# Patient Record
Sex: Female | Born: 1935 | Race: White | Hispanic: No | Smoking: Never smoker
Health system: Southern US, Community
[De-identification: ages and names within clinical notes are randomized; demographics above are authoritative.]

## PROBLEM LIST (undated history)

## (undated) DIAGNOSIS — F419 Anxiety disorder, unspecified: Secondary | ICD-10-CM

## (undated) DIAGNOSIS — F329 Major depressive disorder, single episode, unspecified: Secondary | ICD-10-CM

## (undated) DIAGNOSIS — F0390 Unspecified dementia without behavioral disturbance: Secondary | ICD-10-CM

## (undated) DIAGNOSIS — E785 Hyperlipidemia, unspecified: Secondary | ICD-10-CM

## (undated) DIAGNOSIS — I10 Essential (primary) hypertension: Secondary | ICD-10-CM

## (undated) DIAGNOSIS — E119 Type 2 diabetes mellitus without complications: Secondary | ICD-10-CM

## (undated) DIAGNOSIS — F32A Depression, unspecified: Secondary | ICD-10-CM

## (undated) HISTORY — DX: Anxiety disorder, unspecified: F41.9

## (undated) HISTORY — PX: OTHER SURGICAL HISTORY: SHX169

## (undated) HISTORY — DX: Major depressive disorder, single episode, unspecified: F32.9

## (undated) HISTORY — DX: Depression, unspecified: F32.A

---

## 2001-11-27 ENCOUNTER — Inpatient Hospital Stay (HOSPITAL_COMMUNITY): Admission: EM | Admit: 2001-11-27 | Discharge: 2001-11-28 | Payer: Self-pay

## 2001-11-28 ENCOUNTER — Encounter: Payer: Self-pay | Admitting: Internal Medicine

## 2002-08-04 ENCOUNTER — Encounter: Payer: Self-pay | Admitting: Internal Medicine

## 2002-08-04 ENCOUNTER — Encounter: Admission: RE | Admit: 2002-08-04 | Discharge: 2002-08-04 | Payer: Self-pay | Admitting: Internal Medicine

## 2002-08-28 ENCOUNTER — Encounter: Admission: RE | Admit: 2002-08-28 | Discharge: 2002-08-28 | Payer: Self-pay | Admitting: Internal Medicine

## 2002-08-28 ENCOUNTER — Encounter: Payer: Self-pay | Admitting: Internal Medicine

## 2003-02-11 ENCOUNTER — Other Ambulatory Visit: Admission: RE | Admit: 2003-02-11 | Discharge: 2003-02-11 | Payer: Self-pay | Admitting: Obstetrics and Gynecology

## 2004-02-29 ENCOUNTER — Ambulatory Visit (HOSPITAL_COMMUNITY): Admission: RE | Admit: 2004-02-29 | Discharge: 2004-02-29 | Payer: Self-pay | Admitting: Internal Medicine

## 2004-04-03 ENCOUNTER — Encounter: Admission: RE | Admit: 2004-04-03 | Discharge: 2004-04-03 | Payer: Self-pay | Admitting: Internal Medicine

## 2004-05-05 ENCOUNTER — Ambulatory Visit: Payer: Self-pay | Admitting: Internal Medicine

## 2004-10-26 ENCOUNTER — Ambulatory Visit: Payer: Self-pay | Admitting: Internal Medicine

## 2005-05-21 ENCOUNTER — Ambulatory Visit: Payer: Self-pay | Admitting: Internal Medicine

## 2005-05-30 ENCOUNTER — Other Ambulatory Visit: Admission: RE | Admit: 2005-05-30 | Discharge: 2005-05-30 | Payer: Self-pay | Admitting: Obstetrics and Gynecology

## 2005-06-27 ENCOUNTER — Encounter: Payer: Self-pay | Admitting: Internal Medicine

## 2005-10-10 ENCOUNTER — Ambulatory Visit: Payer: Self-pay | Admitting: Internal Medicine

## 2005-10-16 ENCOUNTER — Encounter: Admission: RE | Admit: 2005-10-16 | Discharge: 2005-10-16 | Payer: Self-pay | Admitting: Internal Medicine

## 2005-10-26 ENCOUNTER — Ambulatory Visit: Payer: Self-pay | Admitting: Internal Medicine

## 2005-10-30 ENCOUNTER — Ambulatory Visit: Payer: Self-pay | Admitting: Internal Medicine

## 2006-04-17 ENCOUNTER — Ambulatory Visit: Payer: Self-pay | Admitting: Internal Medicine

## 2006-10-04 DIAGNOSIS — M949 Disorder of cartilage, unspecified: Secondary | ICD-10-CM

## 2006-10-04 DIAGNOSIS — M899 Disorder of bone, unspecified: Secondary | ICD-10-CM | POA: Insufficient documentation

## 2006-10-04 DIAGNOSIS — E039 Hypothyroidism, unspecified: Secondary | ICD-10-CM | POA: Insufficient documentation

## 2006-10-04 DIAGNOSIS — D320 Benign neoplasm of cerebral meninges: Secondary | ICD-10-CM | POA: Insufficient documentation

## 2006-12-08 ENCOUNTER — Encounter: Payer: Self-pay | Admitting: Internal Medicine

## 2006-12-19 ENCOUNTER — Ambulatory Visit: Payer: Self-pay | Admitting: Internal Medicine

## 2006-12-19 DIAGNOSIS — E785 Hyperlipidemia, unspecified: Secondary | ICD-10-CM

## 2006-12-19 DIAGNOSIS — B351 Tinea unguium: Secondary | ICD-10-CM | POA: Insufficient documentation

## 2006-12-19 LAB — CONVERTED CEMR LAB
ALT: 16 units/L (ref 0–35)
AST: 19 units/L (ref 0–37)
BUN: 16 mg/dL (ref 6–23)
CO2: 30 meq/L (ref 19–32)
Calcium: 10 mg/dL (ref 8.4–10.5)
Chloride: 105 meq/L (ref 96–112)
Creatinine, Ser: 0.6 mg/dL (ref 0.4–1.2)
GFR calc Af Amer: 125 mL/min
GFR calc non Af Amer: 104 mL/min
Glucose, Bld: 92 mg/dL (ref 70–99)
Potassium: 4.2 meq/L (ref 3.5–5.1)
Sodium: 141 meq/L (ref 135–145)

## 2006-12-20 ENCOUNTER — Telehealth (INDEPENDENT_AMBULATORY_CARE_PROVIDER_SITE_OTHER): Payer: Self-pay | Admitting: *Deleted

## 2006-12-26 ENCOUNTER — Telehealth: Payer: Self-pay | Admitting: Internal Medicine

## 2007-01-06 ENCOUNTER — Encounter: Payer: Self-pay | Admitting: Internal Medicine

## 2007-01-07 ENCOUNTER — Encounter: Payer: Self-pay | Admitting: Internal Medicine

## 2007-01-08 ENCOUNTER — Encounter: Payer: Self-pay | Admitting: Internal Medicine

## 2007-02-10 ENCOUNTER — Telehealth (INDEPENDENT_AMBULATORY_CARE_PROVIDER_SITE_OTHER): Payer: Self-pay | Admitting: *Deleted

## 2007-11-26 ENCOUNTER — Encounter: Admission: RE | Admit: 2007-11-26 | Discharge: 2007-11-26 | Payer: Self-pay | Admitting: Family Medicine

## 2008-01-27 ENCOUNTER — Emergency Department (HOSPITAL_COMMUNITY): Admission: EM | Admit: 2008-01-27 | Discharge: 2008-01-27 | Payer: Self-pay | Admitting: Emergency Medicine

## 2010-11-10 NOTE — Consult Note (Signed)
[place]. [facility]  Patient:    Emily Heath, Emily Heath Visit Number: [id number] MRN: [id number]          Service Type: MED Location: [phone] Attending Physician:  Carrie Mew Dictated by:   Rollene Rotunda, M.D. [facility] Proc. Date: 11/27/01 Admit Date:  11/27/2001 Discharge Date: 11/28/2001                            Consultation Report  PRIMARY CARE PHYSICIAN:  Angelena Sole, M.D.  REASON FOR CONSULTATION:  Evaluate patient with chest pain.  HISTORY OF PRESENT ILLNESS:  The patient is a lovely 77 year old originally from [place].  She has had no prior cardiac history.  She developed chest pain yesterday.  This was at rest.  This was waxing and waning with sharp discomfort.  It was substernal.  She took a few aspirin.  She does not know whether this actually helped, although the pain went away for awhile.  She did have recurrence of it.  She went to see Dr. Angelena Sole today and was referred to the ER.  She has had no discomfort today.  She had no associated nausea, vomiting, or diaphoresis.  This has been a moderate intensity.  There was no radiation to the jaw or to her arms.  She has not had discomfort like this before.  It is not reproducible with exertion.  She is limited by knee pain; however, with her activities of daily living, she has never been able to bring on chest pressure, acitivty-induced nausea, vomiting, or diaphoresis, or other anginal symptoms.  PAST MEDICAL HISTORY:  Arthritis.  Hypothyroidism.  Hyperlipidemia.  ALLERGIES:  The patient has GI distress with LIPITOR.  MEDICATIONS: 1. Fosamax 70 mg once a week. 2. Climara. 3. Synthroid 0.025 mg q.d. 4. Multivitamin. 5. Celebrex. 6. Os-Cal.  SOCIAL HISTORY:  The patient lives in [place].  She is widowed.  She has one son.  She has never smoked cigarettes and does not drink alcohol.  FAMILY HISTORY:  Contributory for a father dying at 30 of vascular disease.  A brother  with "circulation problems."  Otherwise, as stated in the HPI. Negative for other systems.  PHYSICAL EXAMINATION:  GENERAL:  The patient is in no distress.  VITAL SIGNS:  Blood pressure 130/70, heart rate 77 and regular.  HEENT:  Eyes unremarkable.  Pupils are equal, round and reactive to light. Fundi not visualized.  Oral mucosa unremarkable.  NECK:  No jugular venous distension.  Wave form within normal limits.  Carotid upstrokes brisk and symmetric.  No bruits or thyromegaly.  LYMPH NODES:  No adenopathy.  LUNGS:  Clear to auscultation bilaterally.  BACK:  No costovertebral angle tenderness.  CHEST:  Unremarkable.  HEART:  PMI not displaced or sustained.  S1 and S2 within normal limits.  No S3, S4, and no murmurs.  ABDOMEN:  Flat, positive bowel sounds, normal in frequency and pitch.  No bruits, rebound, guarding, midline pulsatile mass, hepatosplenomegaly.  SKIN:  No rash or nodules.  EXTREMITIES:  There are 2+ pulses throughout.  No edema.  NEUROLOGICAL:  Oriented to person, place, and time.  Cranial nerves II-XII grossly intact.  Motor grossly intact.  LABORATORY DATA:  Hemoglobin 13.9, WBC 5.9, platelets 267,000.  Sodium 141, potassium 3.9, BUN 10, creatinine 0.6.  CK 59, MB 1.7, troponin 0.02, INR 1.0.  EKG sinus rhythm, rate 84, axis within normal limits.  Intervals within normal limits.  No acute ST or T-wave changes.  Chest x-ray pending.  ASSESSMENT/PLAN: 1. The patients chest discomfort is atypical:  She has minimal cardiovascular    risk factors (age, hyperlipidemia).  Given this, I think the pretest    probability of obstructive coronary disease is somewhat low.  I think a    stress perfusion study would be excellent for screening for this.  The    patient would be able to ambulate, so she will have an Adenosine perfusion    study. 2. Hyperlipidemia:  This can be assessed and managed according to the    guidelines with the calculation of her 10-year  planning and risk score. Dictated by:   Rollene Rotunda, M.D. [facility] Attending Physician:  Carrie Mew DD:  11/27/01 TD:  11/30/01 Job: [id number]

## 2010-11-10 NOTE — Discharge Summary (Signed)
[place]. [facility]  Patient:    Emily Heath, Emily Heath Visit Number: [id number] MRN: [id number]          Service Type: MED Location: [phone] Attending Physician:  Carrie Mew Dictated by:   Cornell Barman, P.A. Admit Date:  11/27/2001 Discharge Date: 11/28/2001   CC:         Angelena Sole, M.D. [facility]   Discharge Summary  DISCHARGE DIAGNOSES:  1. Chest pain.  2. Myocardial infarction ruled out.  HISTORY OF PRESENT ILLNESS: Ms. Templer is a 77 year old white female who experienced mid sternal chest pain the day prior to this admission.  She had multiple episodes.  She took two aspirin, which provided relief.  Also aspirin improved her left arm pain.  PAST MEDICAL HISTORY:  1. Osteoarthritis.  2. Hyperlipidemia.  Intolerant to LIPITOR.  3. Hypothyroidism.  4. Status post total hysterectomy and oophorectomy.  HOSPITAL COURSE: Cardiovascular - The patient was admitted with chest pain. Cardiac enzymes were negative for myocardial infarction.  She was started on heparin and cardiology was asked to see the patient.  Dr. Antoine Poche saw the patient and recommended a Cardiolite.  He stated if her Cardiolite was negative he would not recommend any further work-up.  The patients Cardiolite is currently pending and, as noted, if it is negative we will discharge the patient home.  The patient did rule out for myocardial infarction.  DISCHARGE LABORATORY DATA: Cardiac enzymes were negative x3 sets.  TSH was 2.466.  UA was negative.  D-dimer was less than 0.22.  Coags were normal.  CBC with Differential was normal.  BMET was normal.  EKG was without any signs of ischemia.  DISCHARGE MEDICATIONS:  1. Fosamax 70 mg q.8h.  2. Climara patch 0.1 mg q.8h.  3. Synthroid 0.025 mg q.d.  4. Os-Cal as at home.  5. Celebrex as at home.  FOLLOW-UP: Follow up with Dr. Ruthine Dose in two to three weeks. Dictated by:   Cornell Barman, P.A. Attending Physician:  Carrie Mew DD:  11/28/01 TD:  12/01/01 Job: [id number]

## 2014-12-22 ENCOUNTER — Encounter (HOSPITAL_COMMUNITY): Payer: Self-pay | Admitting: Emergency Medicine

## 2014-12-22 ENCOUNTER — Emergency Department (HOSPITAL_COMMUNITY)
Admission: EM | Admit: 2014-12-22 | Discharge: 2014-12-22 | Disposition: A | Discharge status: Home / Self Care | Payer: Medicare Other | Attending: Emergency Medicine | Admitting: Emergency Medicine

## 2014-12-22 DIAGNOSIS — R4182 Altered mental status, unspecified: Secondary | ICD-10-CM | POA: Diagnosis present

## 2014-12-22 DIAGNOSIS — Z8639 Personal history of other endocrine, nutritional and metabolic disease: Secondary | ICD-10-CM | POA: Diagnosis not present

## 2014-12-22 DIAGNOSIS — R410 Disorientation, unspecified: Secondary | ICD-10-CM | POA: Diagnosis not present

## 2014-12-22 DIAGNOSIS — E119 Type 2 diabetes mellitus without complications: Secondary | ICD-10-CM | POA: Diagnosis not present

## 2014-12-22 DIAGNOSIS — I10 Essential (primary) hypertension: Secondary | ICD-10-CM | POA: Diagnosis not present

## 2014-12-22 HISTORY — DX: Type 2 diabetes mellitus without complications: E11.9

## 2014-12-22 HISTORY — DX: Hyperlipidemia, unspecified: E78.5

## 2014-12-22 HISTORY — DX: Essential (primary) hypertension: I10

## 2014-12-22 NOTE — ED Notes (Signed)
Per EMS pt wandering in [employer] taken here for evaluation; pt alert and oriented to self, place, and situation; not time. Pt good historian of past events but not present. [employer] attempting to contact family at present time; en route to daughter in law house. Pt states "they took me here because I am crazy, but I am not; I just want to go home." Pt denies pain or other complaint.

## 2014-12-22 NOTE — Progress Notes (Addendum)
EDCM called patient's family at phone number [phone].  Left generic message with phone number for call back.  12/22/2014 A. Najae Rathert RNCM 1644pm EDCM received phone call from patient's daughter in law Emily Heath.  Emily Heath reports she has taken the patient back home and she is doing fine. "She's all better."  EDCM asked Emily Heath if she needed information on ALF? If so, EDCM can have the social worker contact her.  Emily Heath reports patient's son Emily Heath is out of town for two weeks and he will be calling [facility] for resources or "What's best for his mom."  EDCM informed Emily Heath that patient would probably benefit from seeing a neurologist.  [facility] asked Emily Heath if patient has a neurologist? Emily Heath does not know if patient has a neurologist.  Emily Heath would not let EDCM give her any further resources or information. She prefers to wait until Emily Heath calls [facility].  Emily Heath reports she will call if she needs anything further.  No further EDCM needs at this time.

## 2014-12-22 NOTE — ED Notes (Addendum)
Pt refuses VS at discharge; "I just want to go home." Daughter in law, Margaretha Sheffield reports pt at baseline.

## 2014-12-22 NOTE — Discharge Instructions (Signed)
Confusion Confusion is the inability to think with your usual speed or clarity. Confusion may come on quickly or slowly over time. How quickly the confusion comes on depends on the cause. Confusion can be due to any number of causes. CAUSES   Concussion, head injury, or head trauma.  Seizures.  Stroke.  Fever.  Brain tumor.  Age related decreased brain function (dementia).  Heightened emotional states like rage or terror.  Mental illness in which the person loses the ability to determine what is real and what is not (hallucinations).  Infections such as a urinary tract infection (UTI).  Toxic effects from alcohol, drugs, or prescription medicines.  Dehydration and an imbalance of salts in the body (electrolytes).  Lack of sleep.  Low blood sugar (diabetes).  Low levels of oxygen from conditions such as chronic lung disorders.  Drug interactions or other medicine side effects.  Nutritional deficiencies, especially niacin, thiamine, vitamin C, or vitamin B.  Sudden drop in body temperature (hypothermia).  Change in routine, such as when traveling or hospitalized. SIGNS AND SYMPTOMS  People often describe their thinking as cloudy or unclear when they are confused. Confusion can also include feeling disoriented. That means you are unaware of where or who you are. You may also not know what the date or time is. If confused, you may also have difficulty paying attention, remembering, and making decisions. Some people also act aggressively when they are confused.  DIAGNOSIS  The medical evaluation of confusion may include:  Blood and urine tests.  X-rays.  Brain and nervous system tests.  Analyzing your brain waves (electroencephalogram or EEG).  Magnetic resonance imaging (MRI) of your head.  Computed tomography (CT) scan of your head.  Mental status tests in which your health care provider may ask many questions. Some of these questions may seem silly or strange,  but they are a very important test to help diagnose and treat confusion. TREATMENT  An admission to the hospital may not be needed, but a person with confusion should not be left alone. Stay with a family member or friend until the confusion clears. Avoid alcohol, pain relievers, or sedative drugs until you have fully recovered. Do not drive until directed by your health care provider. HOME CARE INSTRUCTIONS  What family and friends can do:  To find out if someone is confused, ask the person to state his or her name, age, and the date. If the person is unsure or answers incorrectly, he or she is confused.  Always introduce yourself, no matter how well the person knows you.  Often remind the person of his or her location.  Place a calendar and clock near the confused person.  Help the person with his or her medicines. You may want to use a pill box, an alarm as a reminder, or give the person each dose as prescribed.  Talk about current events and plans for the day.  Try to keep the environment calm, quiet, and peaceful.  Make sure the person keeps follow-up visits with his or her health care provider. PREVENTION  Ways to prevent confusion:  Avoid alcohol.  Eat a balanced diet.  Get enough sleep.  Take medicine only as directed by your health care provider.  Do not become isolated. Spend time with other people and make plans for your days.  Keep careful watch on your blood sugar levels if you are diabetic. SEEK IMMEDIATE MEDICAL CARE IF:   You develop severe headaches, repeated vomiting, seizures, blackouts, or   slurred speech.  There is increasing confusion, weakness, numbness, restlessness, or personality changes.  You develop a loss of balance, have marked dizziness, feel uncoordinated, or fall.  You have delusions, hallucinations, or develop severe anxiety.  Your family members think you need to be rechecked. Document Released: 07/19/2004 Document Revised: 10/26/2013  Document Reviewed: 07/17/2013 ExitCare Patient Information 2015 [employer]. This information is not intended to replace advice given to you by your health care provider. Make sure you discuss any questions you have with your health care provider.  

## 2014-12-22 NOTE — ED Notes (Signed)
Spoke with Maudie Mercury CM reports will call family.

## 2014-12-22 NOTE — ED Notes (Signed)
Attempt to call number listed under demographics [phone]; no answer.

## 2014-12-22 NOTE — ED Provider Notes (Signed)
CSN: [id number]     Arrival date & time 12/22/14  1437 History   None    Chief Complaint  Patient presents with  . Altered Mental Status   Level V caveat: Dementia  (Consider location/radiation/quality/duration/timing/severity/associated sxs/prior Treatment) HPI Emily Heath is a 81 y.o. female who presents today via EMS for altered mental status. Per patient, at approximately 3:00 this afternoon she was trying to purchase something at [employer], forgot where she was and asked police for assistance. She reports the police then brought her to the emergency room. She denies any medical problems at this time and reports that she "just wants to go home". Patient reports she lives at home alone and denies any other past medical history other than high cholesterol. Denies fevers, chills, headache, chest pain, short of breath, nausea or vomiting, abdominal pain, urinary symptoms, changes in bowel characteristics.    Past Medical History  Diagnosis Date  . Diabetes mellitus without complication   . Hypertension   . Hyperlipemia    History reviewed. No pertinent past surgical history. No family history on file. History  Substance Use Topics  . Smoking status: Not on file  . Smokeless tobacco: Not on file  . Alcohol Use: Not on file   OB History    No data available     Review of Systems A 10 point review of systems was completed and was negative except for pertinent positives and negatives as mentioned in the history of present illness     Allergies  Review of patient's allergies indicates no known allergies.  Home Medications   Prior to Admission medications   Not on File   BP 154/73 mmHg  Pulse 88  Temp(Src) 98.2 F (36.8 C) (Oral)  Resp 18  SpO2 95% Physical Exam  Constitutional: She is oriented to person, place, and time. She appears well-developed and well-nourished.  HENT:  Head: Normocephalic and atraumatic.  Mouth/Throat: Oropharynx is clear and moist.  Eyes:  Conjunctivae are normal. Pupils are equal, round, and reactive to light. Right eye exhibits no discharge. Left eye exhibits no discharge. No scleral icterus.  Neck: Neck supple.  Cardiovascular: Normal rate, regular rhythm and normal heart sounds.   Pulmonary/Chest: Effort normal and breath sounds normal. No respiratory distress. She has no wheezes. She has no rales.  Abdominal: Soft. There is no tenderness.  Musculoskeletal: She exhibits no tenderness.  Neurological: She is alert and oriented to person, place, and time.  Cranial Nerves II-XII grossly intact. Moves all extremities without ataxia. Gait is baseline. No focal neurological deficits.  Skin: Skin is warm and dry. No rash noted.  Psychiatric: She has a normal mood and affect.  Nursing note and vitals reviewed.   ED Course  Procedures (including critical care time) Labs Review Labs Reviewed - No data to display  Imaging Review No results found.   EKG Interpretation None      MDM  Vitals stable - WNL -afebrile Pt resting comfortably in ED. PE--physical exam is not concerning.   Spoke with daughter-in-law, Emily Heath as well as patient's son, Emily Heath. Both report patient has dementia and that this is baseline activity for patient. They prefer to have patient return home. Discussed case management can call them with outpatient resources for possible assisted living arrangements. Patient and family amenable to plan. There is no evidence of other acute or emergent pathology that requires the patient to stay in the ED any longer. Will DC home in the care of daughter-in-law, Emily Heath. I  discussed all relevant lab findings and imaging results with pt and they verbalized understanding. Discussed f/u with PCP within 48 hrs and return precautions, pt very amenable to plan.  Final diagnoses:  Confusion        Comer Locket, PA-C 12/22/14 1828  Evelina Bucy, MD 12/22/14 [phone]

## 2014-12-22 NOTE — ED Notes (Addendum)
Per EMS; [employer] was able to contact daughter in law; en route now. Pt sitting in hall at present time; refusing ED room, television, or snack; "I just want to go home; I am not crazy; you all are making me confused; that is why I am here."

## 2016-05-09 ENCOUNTER — Ambulatory Visit (INDEPENDENT_AMBULATORY_CARE_PROVIDER_SITE_OTHER): Payer: Medicare Other | Admitting: Neurology

## 2016-05-09 ENCOUNTER — Encounter: Payer: Self-pay | Admitting: Neurology

## 2016-05-09 VITALS — BP 150/52 | HR 64 | Ht 58.5 in | Wt 136.0 lb

## 2016-05-09 DIAGNOSIS — F0391 Unspecified dementia with behavioral disturbance: Secondary | ICD-10-CM

## 2016-05-09 DIAGNOSIS — G301 Alzheimer's disease with late onset: Secondary | ICD-10-CM

## 2016-05-09 DIAGNOSIS — E538 Deficiency of other specified B group vitamins: Secondary | ICD-10-CM | POA: Diagnosis not present

## 2016-05-09 DIAGNOSIS — F0280 Dementia in other diseases classified elsewhere without behavioral disturbance: Secondary | ICD-10-CM | POA: Diagnosis not present

## 2016-05-09 NOTE — Patient Instructions (Addendum)
Remember to drink plenty of fluid, eat healthy meals and do not skip any meals. Try to eat protein with a every meal and eat a healthy snack such as fruit or nuts in between meals. Try to keep a regular sleep-wake schedule and try to exercise daily, particularly in the form of walking, 20-30 minutes a day, if you can.   As far as your medications are concerned, I would like to suggest: continue Donepezil  As far as diagnostic testing: MRI brain, labs  I would like to see you back in 6 months, sooner if we need to. Please call us with any interim questions, concerns, problems, updates or refill requests.   Our phone number is [phone]. We also have an after hours call service for urgent matters and there is a physician on-call for urgent questions. For any emergencies you know to call 911 or go to the nearest emergency room  [email or web address]  I suggest looking in to a 'memory unit"
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
## 2016-05-12 ENCOUNTER — Encounter: Payer: Self-pay | Admitting: Neurology

## 2016-05-12 DIAGNOSIS — F0391 Unspecified dementia with behavioral disturbance: Secondary | ICD-10-CM | POA: Insufficient documentation

## 2016-05-12 DIAGNOSIS — F03918 Unspecified dementia, unspecified severity, with other behavioral disturbance: Secondary | ICD-10-CM | POA: Insufficient documentation

## 2016-05-14 LAB — B12 AND FOLATE PANEL
Folate: 16.3 ng/mL (ref >3.0)
Vitamin B-12: 347 pg/mL (ref 211–946)

## 2016-05-14 LAB — METHYLMALONIC ACID, SERUM: Methylmalonic Acid: 147 nmol/L (ref 0–378)

## 2016-05-15 ENCOUNTER — Telehealth: Payer: Self-pay | Admitting: *Deleted

## 2016-05-15 NOTE — Telephone Encounter (Signed)
LVM informing patient that per DR Jaynee Eagles, her lab results are normal. Left name, number for any questions.

## 2016-06-12 ENCOUNTER — Inpatient Hospital Stay: Admission: RE | Admit: 2016-06-12 | Payer: Medicare Other | Source: Ambulatory Visit

## 2016-06-18 ENCOUNTER — Inpatient Hospital Stay (HOSPITAL_COMMUNITY)
Admission: EM | Admit: 2016-06-18 | Discharge: 2016-06-22 | DRG: 071 | Disposition: A | Discharge status: Skilled Nursing Facility | Payer: Medicare Other | Attending: Internal Medicine | Admitting: Internal Medicine

## 2016-06-18 ENCOUNTER — Encounter (HOSPITAL_COMMUNITY): Payer: Self-pay | Admitting: *Deleted

## 2016-06-18 ENCOUNTER — Emergency Department (HOSPITAL_COMMUNITY): Payer: Medicare Other

## 2016-06-18 DIAGNOSIS — T148XXA Other injury of unspecified body region, initial encounter: Secondary | ICD-10-CM

## 2016-06-18 DIAGNOSIS — T796XXA Traumatic ischemia of muscle, initial encounter: Secondary | ICD-10-CM

## 2016-06-18 DIAGNOSIS — F0390 Unspecified dementia without behavioral disturbance: Secondary | ICD-10-CM | POA: Diagnosis present

## 2016-06-18 DIAGNOSIS — E876 Hypokalemia: Secondary | ICD-10-CM | POA: Diagnosis present

## 2016-06-18 DIAGNOSIS — G9341 Metabolic encephalopathy: Principal | ICD-10-CM | POA: Diagnosis present

## 2016-06-18 DIAGNOSIS — R4182 Altered mental status, unspecified: Secondary | ICD-10-CM | POA: Diagnosis present

## 2016-06-18 DIAGNOSIS — I10 Essential (primary) hypertension: Secondary | ICD-10-CM | POA: Diagnosis not present

## 2016-06-18 DIAGNOSIS — Z66 Do not resuscitate: Secondary | ICD-10-CM | POA: Diagnosis present

## 2016-06-18 DIAGNOSIS — Z79899 Other long term (current) drug therapy: Secondary | ICD-10-CM

## 2016-06-18 DIAGNOSIS — E538 Deficiency of other specified B group vitamins: Secondary | ICD-10-CM | POA: Diagnosis present

## 2016-06-18 DIAGNOSIS — E039 Hypothyroidism, unspecified: Secondary | ICD-10-CM | POA: Diagnosis not present

## 2016-06-18 DIAGNOSIS — E119 Type 2 diabetes mellitus without complications: Secondary | ICD-10-CM | POA: Diagnosis present

## 2016-06-18 DIAGNOSIS — R55 Syncope and collapse: Secondary | ICD-10-CM | POA: Diagnosis not present

## 2016-06-18 DIAGNOSIS — R059 Cough, unspecified: Secondary | ICD-10-CM

## 2016-06-18 DIAGNOSIS — R05 Cough: Secondary | ICD-10-CM | POA: Diagnosis present

## 2016-06-18 DIAGNOSIS — M6281 Muscle weakness (generalized): Secondary | ICD-10-CM

## 2016-06-18 DIAGNOSIS — S5001XA Contusion of right elbow, initial encounter: Secondary | ICD-10-CM

## 2016-06-18 DIAGNOSIS — M6282 Rhabdomyolysis: Secondary | ICD-10-CM | POA: Diagnosis present

## 2016-06-18 DIAGNOSIS — E785 Hyperlipidemia, unspecified: Secondary | ICD-10-CM | POA: Diagnosis present

## 2016-06-18 DIAGNOSIS — W19XXXA Unspecified fall, initial encounter: Secondary | ICD-10-CM | POA: Diagnosis present

## 2016-06-18 DIAGNOSIS — Y92009 Unspecified place in unspecified non-institutional (private) residence as the place of occurrence of the external cause: Secondary | ICD-10-CM

## 2016-06-18 LAB — URINALYSIS, ROUTINE W REFLEX MICROSCOPIC
Bacteria, UA: NONE SEEN
Bilirubin Urine: NEGATIVE
Glucose, UA: 50 mg/dL — AB
Ketones, ur: 5 mg/dL — AB
Leukocytes, UA: NEGATIVE
Nitrite: NEGATIVE
Protein, ur: 30 mg/dL — AB
Specific Gravity, Urine: 1.023 (ref 1.005–1.030)
Squamous Epithelial / HPF: NONE SEEN
pH: 5 (ref 5.0–8.0)

## 2016-06-18 LAB — RAPID URINE DRUG SCREEN, HOSP PERFORMED
Amphetamines: NOT DETECTED
BARBITURATES: NOT DETECTED
Benzodiazepines: NOT DETECTED
Cocaine: NOT DETECTED
Opiates: NOT DETECTED
Tetrahydrocannabinol: NOT DETECTED

## 2016-06-18 LAB — CBC WITH DIFFERENTIAL/PLATELET
Basophils Absolute: 0 10*3/uL (ref 0.0–0.1)
Basophils Relative: 0 %
Eosinophils Absolute: 0 10*3/uL (ref 0.0–0.7)
Eosinophils Relative: 0 %
HCT: 38.3 % (ref 36.0–46.0)
Hemoglobin: 12.9 g/dL (ref 12.0–15.0)
Lymphocytes Relative: 5 %
Lymphs Abs: 0.3 10*3/uL — ABNORMAL LOW (ref 0.7–4.0)
MCH: 29.9 pg (ref 26.0–34.0)
MCHC: 33.7 g/dL (ref 30.0–36.0)
MCV: 88.7 fL (ref 78.0–100.0)
Monocytes Absolute: 0.8 10*3/uL (ref 0.1–1.0)
Monocytes Relative: 11 %
Neutro Abs: 5.9 10*3/uL (ref 1.7–7.7)
Neutrophils Relative %: 84 %
Platelets: 228 10*3/uL (ref 150–400)
RBC: 4.32 MIL/uL (ref 3.87–5.11)
RDW: 13.3 % (ref 11.5–15.5)
WBC: 7 10*3/uL (ref 4.0–10.5)

## 2016-06-18 LAB — I-STAT CHEM 8, ED
BUN: 19 mg/dL (ref 6–20)
Calcium, Ion: 1.06 mmol/L — ABNORMAL LOW (ref 1.15–1.40)
Chloride: 101 mmol/L (ref 101–111)
Creatinine, Ser: 0.8 mg/dL (ref 0.44–1.00)
Glucose, Bld: 122 mg/dL — ABNORMAL HIGH (ref 65–99)
HCT: 38 % (ref 36.0–46.0)
Hemoglobin: 12.9 g/dL (ref 12.0–15.0)
Potassium: 3.5 mmol/L (ref 3.5–5.1)
Sodium: 140 mmol/L (ref 135–145)
TCO2: 25 mmol/L (ref 0–100)

## 2016-06-18 LAB — CK TOTAL AND CKMB (NOT AT ARMC)
CK, MB: 27.1 ng/mL — ABNORMAL HIGH (ref 0.5–5.0)
Relative Index: 0.7 (ref 0.0–2.5)
Total CK: 3903 U/L — ABNORMAL HIGH (ref 38–234)

## 2016-06-18 LAB — CBC
HEMATOCRIT: 38.6 % (ref 36.0–46.0)
HEMOGLOBIN: 13 g/dL (ref 12.0–15.0)
MCH: 30.2 pg (ref 26.0–34.0)
MCHC: 33.7 g/dL (ref 30.0–36.0)
MCV: 89.8 fL (ref 78.0–100.0)
Platelets: 199 10*3/uL (ref 150–400)
RBC: 4.3 MIL/uL (ref 3.87–5.11)
RDW: 13.5 % (ref 11.5–15.5)
WBC: 8.4 10*3/uL (ref 4.0–10.5)

## 2016-06-18 LAB — CREATININE, SERUM
CREATININE: 0.71 mg/dL (ref 0.44–1.00)
GFR calc Af Amer: >60 mL/min (ref >60)

## 2016-06-18 LAB — TROPONIN I
TROPONIN I: 0.04 ng/mL — AB (ref <0.03)
Troponin I: 0.04 ng/mL (ref <0.03)

## 2016-06-18 LAB — I-STAT TROPONIN, ED: Troponin i, poc: 0.03 ng/mL (ref 0.00–0.08)

## 2016-06-18 LAB — TSH: TSH: 0.709 u[IU]/mL (ref 0.350–4.500)

## 2016-06-18 MED ORDER — ONDANSETRON HCL 4 MG PO TABS: 4.0000 mg | ORAL_TABLET | Freq: Four times a day (QID) | ORAL | Status: DC | PRN

## 2016-06-18 MED ORDER — ACETAMINOPHEN 325 MG PO TABS
650.0000 mg | ORAL_TABLET | Freq: Four times a day (QID) | ORAL | Status: DC | PRN
  Administered 2016-06-20: 650 mg via ORAL
  Filled 2016-06-18: qty 2

## 2016-06-18 MED ORDER — ONDANSETRON HCL 4 MG/2ML IJ SOLN: 4.0000 mg | Freq: Four times a day (QID) | INTRAMUSCULAR | Status: DC | PRN

## 2016-06-18 MED ORDER — ACETAMINOPHEN 650 MG RE SUPP: 650.0000 mg | Freq: Four times a day (QID) | RECTAL | Status: DC | PRN

## 2016-06-18 MED ORDER — HEPARIN SODIUM (PORCINE) 5000 UNIT/ML IJ SOLN
5000.0000 [IU] | Freq: Three times a day (TID) | INTRAMUSCULAR | Status: DC
  Administered 2016-06-18 – 2016-06-21 (×9): 5000 [IU] via SUBCUTANEOUS
  Filled 2016-06-18 (×10): qty 1

## 2016-06-18 MED ORDER — ACETAMINOPHEN 325 MG PO TABS: 650.0000 mg | ORAL_TABLET | Freq: Four times a day (QID) | ORAL | Status: DC | PRN

## 2016-06-18 MED ORDER — TRAMADOL HCL 50 MG PO TABS
50.0000 mg | ORAL_TABLET | Freq: Four times a day (QID) | ORAL | Status: DC | PRN
  Administered 2016-06-18 – 2016-06-20 (×2): 50 mg via ORAL
  Filled 2016-06-18 (×2): qty 1

## 2016-06-18 MED ORDER — SENNOSIDES-DOCUSATE SODIUM 8.6-50 MG PO TABS: 1.0000 | ORAL_TABLET | Freq: Every evening | ORAL | Status: DC | PRN

## 2016-06-18 MED ORDER — VITAMIN D (ERGOCALCIFEROL) 1.25 MG (50000 UNIT) PO CAPS
50000.0000 [IU] | ORAL_CAPSULE | ORAL | Status: DC
  Administered 2016-06-22: 50000 [IU] via ORAL
  Filled 2016-06-18: qty 1

## 2016-06-18 MED ORDER — LEVOTHYROXINE SODIUM 25 MCG PO TABS
25.0000 ug | ORAL_TABLET | Freq: Every day | ORAL | Status: DC
  Administered 2016-06-19 – 2016-06-22 (×4): 25 ug via ORAL
  Filled 2016-06-18 (×4): qty 1

## 2016-06-18 MED ORDER — SODIUM CHLORIDE 0.9% FLUSH
3.0000 mL | Freq: Two times a day (BID) | INTRAVENOUS | Status: DC
  Administered 2016-06-20 – 2016-06-22 (×3): 3 mL via INTRAVENOUS

## 2016-06-18 MED ORDER — SODIUM CHLORIDE 0.9 % IV SOLN
INTRAVENOUS | Status: DC
  Administered 2016-06-18 – 2016-06-19 (×2): via INTRAVENOUS

## 2016-06-18 NOTE — Progress Notes (Signed)
CRITICAL VALUE ALERT  Critical value received:  Troponin 0.04 Date of notification:  06/18/16  Time of notification:  1937  Critical value read back:Yes.    Nurse who received alert:  Reynold Bowen, RN   MD notified (1st page): K Schorr  Time of first page:  1940  MD notified (2nd page):  Time of second page:  Responding MD: N/A  Time MD responded: N/A  No new orders given

## 2016-06-18 NOTE — ED Notes (Signed)
Patient was placed on bed pan, but unable to pee. Will communicate with Baker Janus PA

## 2016-06-18 NOTE — H&P (Signed)
History and Physical    Emily Heath [id number] DOB: [birth year] DOA: 06/18/2016  PCP: Idamae Schuller, MD   Patient Coming from: HOME  Chief Complaint: Unwitnessed Fall and Confusion  HPI: Emily Heath is a 82 y.o. female with medical history significant of HTN, HLD, Diabetes Mellitus, Hypothyroidism, Dementia, and other comorbids who presented from home with Confusion and Unwitnessed Fall and unknown time of laying on the floor. Patient is pleasantly demented and able to answer questions however is confused and only oriented to place. Does not know her son's name and could be the dementia. Most of the history was gathered from patient's son who is present at bedside. Patient lives by herself and was in normal state of health last night and patient's son left around 11:00pm. This morning when her Home Health Nurse went to check on her patient did not answer the door so she called the son. Son came and patient was found laying on the floor on her right side. Unclear whether she tripped or not. Patient's son got her up and to a chair and gave her food and tylenol because she was complaining of pain. She was acutely more confused than baseline per son so she was brought to the ED for evaluation. She was worked up and found to have mild Rhabdoymyolysis, and admitted for Syncope with unwitnessed fall and Confusion/Acute Metabolic Encephalopathy.  ED Course: Worked up with Head and Neck Ct; Had Blood work and found to be in [place].   Review of Systems: As per HPI otherwise 10 point review of systems negative. Patient states she has no complaints and does not know how she fell. She is pleasantly confused and got agitated saying "no yelling."  Past Medical History:  Diagnosis Date  . Anxiety   . Depression   . Diabetes mellitus without complication ([place])   . Hyperlipemia   . Hypertension     Past Surgical History:  Procedure Laterality Date  . no surgerical history     SOCIAL HISTORY  reports that she has never smoked. She has never used smokeless tobacco. She reports that she does not drink alcohol or use drugs.  No Known Allergies  Family History  Problem Relation Age of Onset  . Cancer Mother   . Alzheimer's disease Neg Hx    Prior to Admission medications   Medication Sig Start Date End Date Taking? Authorizing Provider  acetaminophen (TYLENOL) 325 MG tablet Take 650 mg by mouth every 6 (six) hours as needed for moderate pain.   Yes Historical Provider, MD  donepezil (ARICEPT) 10 MG tablet Take 10 mg by mouth daily.  05/29/16  Yes Historical Provider, MD  levothyroxine (SYNTHROID, LEVOTHROID) 25 MCG tablet Take 25 mcg by mouth daily before breakfast.  05/29/16  Yes Historical Provider, MD  losartan (COZAAR) 50 MG tablet Take 50 mg by mouth daily.  05/29/16  Yes Historical Provider, MD  Vitamin D, Ergocalciferol, (DRISDOL) 50000 units CAPS capsule TAKE ONE CAPSULE BY MOUTH EVERY WEEK. [place] WITH LOSARTAN AND DONEPEZIL 05/29/16   Historical Provider, MD    Physical Exam: Vitals:   06/18/16 1135 06/18/16 1429  BP: 132/55 132/57  Pulse: 89 78  Resp: 14 25  Temp: 97.5 F (36.4 C)   TempSrc: Oral   SpO2: 96% 96%   Constitutional: Thin elderly NAD and appears calm and comfortable and pleasantly confused; Answers questioning  Eyes: Lids and conjunctivae normal, sclerae anicteric  ENMT: External Ears, Nose appear normal. Grossly normal hearing. Red Facial  Erythema on Right check Neck: Appears normal, supple, no cervical masses, normal ROM, no appreciable thyromegaly, no visibile JVD Respiratory: Clear to auscultation bilaterally, no wheezing, rales, rhonchi or crackles. Normal respiratory effort and patient is not tachypenic. No accessory muscle use.  Cardiovascular: RRR, no murmurs / rubs / gallops. S1 and S2 auscultated. No extremity edema. Abdomen: Soft, non-tender, non-distended. No masses palpated. No appreciable hepatosplenomegaly. Bowel sounds  positive x4.  GU: Deferred. Musculoskeletal: No clubbing / cyanosis of digits/nails. No joint deformity upper and lower extremities. No contractures.  Skin: Two skin tears on right leg and Right Elbow tip erythematous. Right Face erythematous. No rashes, lesions or ulcers noted.  Neurologic: CN 2-12 grossly intact with no focal deficits. Sensation intact in all 4 Extremities. Romberg sign cerebellar reflexes not assessed.  Psychiatric: Normal judgment and insight. Alert and awake and only oriented x1. Normal mood and Agitated affect.   Labs on Admission: I have personally reviewed following labs and imaging studies  CBC:  Recent Labs Lab 06/18/16 1330 06/18/16 1343  WBC 7.0  --   NEUTROABS 5.9  --   HGB 12.9 12.9  HCT 38.3 38.0  MCV 88.7  --   PLT 228  --    Basic Metabolic Panel:  Recent Labs Lab 06/18/16 1343  NA 140  K 3.5  CL 101  GLUCOSE 122*  BUN 19  CREATININE 0.80   GFR: CrCl cannot be calculated (Unknown ideal weight.). Liver Function Tests: No results for input(s): AST, ALT, ALKPHOS, BILITOT, PROT, ALBUMIN in the last 168 hours. No results for input(s): LIPASE, AMYLASE in the last 168 hours. No results for input(s): AMMONIA in the last 168 hours. Coagulation Profile: No results for input(s): INR, PROTIME in the last 168 hours. Cardiac Enzymes:  Recent Labs Lab 06/18/16 1332  CKTOTAL 3,903*  CKMB 27.1*   BNP (last 3 results) No results for input(s): PROBNP in the last 8760 hours. HbA1C: No results for input(s): HGBA1C in the last 72 hours. CBG: No results for input(s): GLUCAP in the last 168 hours. Lipid Profile: No results for input(s): CHOL, HDL, LDLCALC, TRIG, CHOLHDL, LDLDIRECT in the last 72 hours. Thyroid Function Tests: No results for input(s): TSH, T4TOTAL, FREET4, T3FREE, THYROIDAB in the last 72 hours. Anemia Panel: No results for input(s): VITAMINB12, FOLATE, FERRITIN, TIBC, IRON, RETICCTPCT in the last 72 hours. Urine analysis:      Component Value Date/Time   COLORURINE YELLOW 06/18/2016 1503   APPEARANCEUR CLEAR 06/18/2016 1503   LABSPEC 1.023 06/18/2016 1503   PHURINE 5.0 06/18/2016 1503   GLUCOSEU 50 (A) 06/18/2016 1503   HGBUR LARGE (A) 06/18/2016 1503   BILIRUBINUR NEGATIVE 06/18/2016 1503   KETONESUR 5 (A) 06/18/2016 1503   PROTEINUR 30 (A) 06/18/2016 1503   NITRITE NEGATIVE 06/18/2016 1503   LEUKOCYTESUR NEGATIVE 06/18/2016 1503   Sepsis Labs: !!!!!!!!!!!!!!!!!!!!!!!!!!!!!!!!!!!!!!!!!!!! @LABRCNTIP (procalcitonin:4,lacticidven:4) )No results found for this or any previous visit (from the past 240 hour(s)).   Radiological Exams on Admission: Dg Pelvis 1-2 Views  Result Date: 06/18/2016 CLINICAL DATA:  Status post fall.  Right-sided pain. EXAM: PELVIS - 1-2 VIEW COMPARISON:  None. FINDINGS: There is no evidence of pelvic fracture or diastasis. No pelvic bone lesions are seen. IMPRESSION: Negative. Electronically Signed   By: Fidela Salisbury M.D.   On: 06/18/2016 13:34   Dg Elbow Complete Right  Result Date: 06/18/2016 CLINICAL DATA:  RIGHT elbow pain post fall this morning EXAM: RIGHT ELBOW - COMPLETE 3+ VIEW COMPARISON:  None FINDINGS: Osseous demineralization.  Radiocapitellar joint space narrowing and mild capitellar irregularity compatible with degenerative changes. Small olecranon spur. No acute fracture, dislocation, or bone destruction. No elbow joint effusion. IMPRESSION: Radiocapitellar degenerative changes. No acute bony abnormalities. Electronically Signed   By: Lavonia Dana M.D.   On: 06/18/2016 12:09   Ct Head Wo Contrast  Result Date: 06/18/2016 CLINICAL DATA:  Fall this morning EXAM: CT HEAD WITHOUT CONTRAST CT CERVICAL SPINE WITHOUT CONTRAST TECHNIQUE: Multidetector CT imaging of the head and cervical spine was performed following the standard protocol without intravenous contrast. Multiplanar CT image reconstructions of the cervical spine were also generated. COMPARISON:  10/16/2005  FINDINGS: CT HEAD FINDINGS Brain: Mild chronic ischemic changes in the periventricular white matter. Small calcified extra-axial mass over the right parietal lobe is stable. There is no mass effect, midline shift, or acute hemorrhage. Vascular: No hyperdense vessel or unexpected calcification. Skull: Cranium is intact. Sinuses/Orbits: There is an air-fluid level in the left maxillary sinus. There is mucosal thickening in the ethmoid air cells. Mastoid air cells are clear. Sphenoid sinuses clear. Other: Noncontributory CT CERVICAL SPINE FINDINGS Alignment: Anatomic alignment. Skull base and vertebrae: No acute fracture. No primary bone lesion or focal pathologic process. Soft tissues and spinal canal: No prevertebral fluid or swelling. No visible canal hematoma. Disc levels: In no evidence of spinal stenosis. Left-sided facet arthropathy occurs throughout the upper and mid cervical spine. Upper chest: Negative. Other: Noncontributory IMPRESSION: No acute intracranial pathology. No evidence of cervical spine injury. Electronically Signed   By: Marybelle Killings M.D.   On: 06/18/2016 14:13   Ct Cervical Spine Wo Contrast  Result Date: 06/18/2016 CLINICAL DATA:  Fall this morning EXAM: CT HEAD WITHOUT CONTRAST CT CERVICAL SPINE WITHOUT CONTRAST TECHNIQUE: Multidetector CT imaging of the head and cervical spine was performed following the standard protocol without intravenous contrast. Multiplanar CT image reconstructions of the cervical spine were also generated. COMPARISON:  10/16/2005 FINDINGS: CT HEAD FINDINGS Brain: Mild chronic ischemic changes in the periventricular white matter. Small calcified extra-axial mass over the right parietal lobe is stable. There is no mass effect, midline shift, or acute hemorrhage. Vascular: No hyperdense vessel or unexpected calcification. Skull: Cranium is intact. Sinuses/Orbits: There is an air-fluid level in the left maxillary sinus. There is mucosal thickening in the ethmoid air  cells. Mastoid air cells are clear. Sphenoid sinuses clear. Other: Noncontributory CT CERVICAL SPINE FINDINGS Alignment: Anatomic alignment. Skull base and vertebrae: No acute fracture. No primary bone lesion or focal pathologic process. Soft tissues and spinal canal: No prevertebral fluid or swelling. No visible canal hematoma. Disc levels: In no evidence of spinal stenosis. Left-sided facet arthropathy occurs throughout the upper and mid cervical spine. Upper chest: Negative. Other: Noncontributory IMPRESSION: No acute intracranial pathology. No evidence of cervical spine injury. Electronically Signed   By: Marybelle Killings M.D.   On: 06/18/2016 14:13   EKG: Independently reviewed. Sinus rhythm at a rate of 79. No evidence of ST-Elevation on my interpretation. Had some possible ST-Depression and poor R wave progression.   Assessment/Plan Active Problems:   Altered mental status   Syncope and collapse  Acute Metabolic Encephalopathy on Concomitant Dementia -Head CT Showed No acute intracranial pathology. No evidence of cervical spine injury. -UDS Negative -Urinalysis showed Negative Leukocytes, Negative Nitrites and 0-5 WBC; Urine Cx Pending -Patient still confused but able to follow commands and answer questions; ?whether worsening Dementia -Swallow Screen by SLP prior to being given a diet. -Hold Donepezil -Check TSH, Mag, Phos -Seen Neurology  in Past -May warrant an EEG  Syncope with Unwitnessed Fall -Head CT Negative -Patient does not know how she ended up on the floor and unknown how long she was laying on floor; There is no evidence of pelvic fracture or diastasis. No pelvic bone lesions are seen on Pelvis X-Ray and Elbow X-Ray showed Radiocapitellar degenerative changes. No acute bony abnormalities. -ECHOcardiogram -Telemetry and Neuro Checks -Hold Sedative Medications and Donepezil  -Troponin I x 3 -Check Orthostatics; Hold BP Meds -PT/OT to Evaluate and Treat  Mild Rhabdomyolysis  from laying on floor from Fall -CK was 3,903 and CK MB was 27.1; Troponin I poc was 0.03 -Urinalysis showed 30 RBC/HPF and Large Hb on Urine Dipstick -IV Hydration with NS at 75 mL/hr; Hold Losartan for now -Repeat CK and CK-MB in AM  Hypothyroidism -Check TSH and Free T4 -C/w Levothyroxine 25 mcg po Daily  Diabetes Mellitus Type 2  -Patient's BS was 122 on Admission -Check HbA1c -Continue to Monitor and if necessary place on SSI  Hypertension -Hold Losartan because of Rhabdo -Continue to Monitor BP's -Add Hydralazine As needed  DVT prophylaxis: Heparin sq Code Status: DO NOT RESUSCITATE Family Communication: Discussed with son at Bedside Disposition Plan: [place] called: None Admission status: Observation   Kerney Elbe, D.O. [facility] Pager [phone]  If 7PM-7AM, please contact night-coverage [email or web address] Password [facility]  06/18/2016, 4:44 PM

## 2016-06-18 NOTE — ED Provider Notes (Cosign Needed)
[facility] DEPT Provider Note   CSN: [id number] Arrival date & time: 06/18/16  1129     History   Chief Complaint Chief Complaint  Patient presents with  . Elbow Pain    HPI Emily Heath is a 82 y.o. female.  HPI  Past Medical History:  Diagnosis Date  . Anxiety   . Depression   . Diabetes mellitus without complication ([place])   . Hyperlipemia   . Hypertension     Patient Active Problem List   Diagnosis Date Noted  . Dementia with behavioral disturbance 05/12/2016  . ONYCHOMYCOSIS 12/19/2006  . HYPERLIPIDEMIA 12/19/2006  . MENINGIOMA 10/04/2006  . HYPOTHYROIDISM 10/04/2006  . OSTEOPENIA 10/04/2006    Past Surgical History:  Procedure Laterality Date  . no surgerical history      OB History    No data available       Home Medications    Prior to Admission medications   Medication Sig Start Date End Date Taking? Authorizing Provider  acetaminophen (TYLENOL) 325 MG tablet Take 650 mg by mouth every 6 (six) hours as needed for moderate pain.   Yes Historical Provider, MD  donepezil (ARICEPT) 10 MG tablet Take 10 mg by mouth daily.  05/29/16  Yes Historical Provider, MD  levothyroxine (SYNTHROID, LEVOTHROID) 25 MCG tablet Take 25 mcg by mouth daily before breakfast.  05/29/16  Yes Historical Provider, MD  losartan (COZAAR) 50 MG tablet Take 50 mg by mouth daily.  05/29/16  Yes Historical Provider, MD  Vitamin D, Ergocalciferol, (DRISDOL) 50000 units CAPS capsule TAKE ONE CAPSULE BY MOUTH EVERY WEEK. [place] WITH LOSARTAN AND DONEPEZIL 05/29/16   Historical Provider, MD    Family History Family History  Problem Relation Age of Onset  . Cancer Mother   . Alzheimer's disease Neg Hx     Social History Social History  Substance Use Topics  . Smoking status: Never Smoker  . Smokeless tobacco: Never Used  . Alcohol use No     Allergies   Patient has no known allergies.   Review of Systems Review of Systems   Physical Exam Updated  Vital Signs BP 132/57 (BP Location: Right Arm)   Pulse 78   Temp 97.5 F (36.4 C) (Oral)   Resp 25   SpO2 96%   Physical Exam   ED Treatments / Results  Labs (all labs ordered are listed, but only abnormal results are displayed) Labs Reviewed  URINALYSIS, ROUTINE W REFLEX MICROSCOPIC - Abnormal; Notable for the following:       Result Value   Glucose, UA 50 (*)    Hgb urine dipstick LARGE (*)    Ketones, ur 5 (*)    Protein, ur 30 (*)    All other components within normal limits  CK TOTAL AND CKMB (NOT AT [facility]) - Abnormal; Notable for the following:    Total CK 3,903 (*)    CK, MB 27.1 (*)    All other components within normal limits  CBC WITH DIFFERENTIAL/PLATELET - Abnormal; Notable for the following:    Lymphs Abs 0.3 (*)    All other components within normal limits  I-STAT CHEM 8, ED - Abnormal; Notable for the following:    Glucose, Bld 122 (*)    Calcium, Ion 1.06 (*)    All other components within normal limits  URINE CULTURE  CK  RAPID URINE DRUG SCREEN, [place], ED    EKG  EKG Interpretation  Date/Time:  Monday  June 18 2016 13:35:56 EST Ventricular Rate:  79 PR Interval:    QRS Duration: 80 QT Interval:  412 QTC Calculation: 473 R Axis:   11 Text Interpretation:  Sinus rhythm Abnormal R-wave progression, early transition Borderline T abnormalities, inferior leads Confirmed by Lacinda Axon  MD, BRIAN ([id number]) on 06/18/2016 1:55:09 PM       Radiology Dg Pelvis 1-2 Views  Result Date: 06/18/2016 CLINICAL DATA:  Status post fall.  Right-sided pain. EXAM: PELVIS - 1-2 VIEW COMPARISON:  None. FINDINGS: There is no evidence of pelvic fracture or diastasis. No pelvic bone lesions are seen. IMPRESSION: Negative. Electronically Signed   By: Fidela Salisbury M.D.   On: 06/18/2016 13:34   Dg Elbow Complete Right  Result Date: 06/18/2016 CLINICAL DATA:  RIGHT elbow pain post fall this morning EXAM: RIGHT ELBOW - COMPLETE 3+ VIEW  COMPARISON:  None FINDINGS: Osseous demineralization. Radiocapitellar joint space narrowing and mild capitellar irregularity compatible with degenerative changes. Small olecranon spur. No acute fracture, dislocation, or bone destruction. No elbow joint effusion. IMPRESSION: Radiocapitellar degenerative changes. No acute bony abnormalities. Electronically Signed   By: Lavonia Dana M.D.   On: 06/18/2016 12:09   Ct Head Wo Contrast  Result Date: 06/18/2016 CLINICAL DATA:  Fall this morning EXAM: CT HEAD WITHOUT CONTRAST CT CERVICAL SPINE WITHOUT CONTRAST TECHNIQUE: Multidetector CT imaging of the head and cervical spine was performed following the standard protocol without intravenous contrast. Multiplanar CT image reconstructions of the cervical spine were also generated. COMPARISON:  10/16/2005 FINDINGS: CT HEAD FINDINGS Brain: Mild chronic ischemic changes in the periventricular white matter. Small calcified extra-axial mass over the right parietal lobe is stable. There is no mass effect, midline shift, or acute hemorrhage. Vascular: No hyperdense vessel or unexpected calcification. Skull: Cranium is intact. Sinuses/Orbits: There is an air-fluid level in the left maxillary sinus. There is mucosal thickening in the ethmoid air cells. Mastoid air cells are clear. Sphenoid sinuses clear. Other: Noncontributory CT CERVICAL SPINE FINDINGS Alignment: Anatomic alignment. Skull base and vertebrae: No acute fracture. No primary bone lesion or focal pathologic process. Soft tissues and spinal canal: No prevertebral fluid or swelling. No visible canal hematoma. Disc levels: In no evidence of spinal stenosis. Left-sided facet arthropathy occurs throughout the upper and mid cervical spine. Upper chest: Negative. Other: Noncontributory IMPRESSION: No acute intracranial pathology. No evidence of cervical spine injury. Electronically Signed   By: Marybelle Killings M.D.   On: 06/18/2016 14:13   Ct Cervical Spine Wo  Contrast  Result Date: 06/18/2016 CLINICAL DATA:  Fall this morning EXAM: CT HEAD WITHOUT CONTRAST CT CERVICAL SPINE WITHOUT CONTRAST TECHNIQUE: Multidetector CT imaging of the head and cervical spine was performed following the standard protocol without intravenous contrast. Multiplanar CT image reconstructions of the cervical spine were also generated. COMPARISON:  10/16/2005 FINDINGS: CT HEAD FINDINGS Brain: Mild chronic ischemic changes in the periventricular white matter. Small calcified extra-axial mass over the right parietal lobe is stable. There is no mass effect, midline shift, or acute hemorrhage. Vascular: No hyperdense vessel or unexpected calcification. Skull: Cranium is intact. Sinuses/Orbits: There is an air-fluid level in the left maxillary sinus. There is mucosal thickening in the ethmoid air cells. Mastoid air cells are clear. Sphenoid sinuses clear. Other: Noncontributory CT CERVICAL SPINE FINDINGS Alignment: Anatomic alignment. Skull base and vertebrae: No acute fracture. No primary bone lesion or focal pathologic process. Soft tissues and spinal canal: No prevertebral fluid or swelling. No visible canal hematoma. Disc levels: In no evidence of  spinal stenosis. Left-sided facet arthropathy occurs throughout the upper and mid cervical spine. Upper chest: Negative. Other: Noncontributory IMPRESSION: No acute intracranial pathology. No evidence of cervical spine injury. Electronically Signed   By: Marybelle Killings M.D.   On: 06/18/2016 14:13    Procedures Procedures (including critical care time)  Medications Ordered in ED Medications - No data to display   Initial Impression / Assessment and Plan / ED Course  I have reviewed the triage vital signs and the nursing notes.  Pertinent labs & imaging results that were available during my care of the patient were reviewed by me and considered in my medical decision making (see chart for details).  Clinical Course    No source for altered  mental staus found at this time    Final Clinical Impressions(s) / ED Diagnoses   Final diagnoses:  Altered mental status, unspecified altered mental status type  Fall, initial encounter  Contusion of right elbow, initial encounter  Abrasion    New Prescriptions New Prescriptions   No medications on file     Junius Creamer, NP 06/18/16 1615    Nat Christen, MD 06/22/16 1438    Nat Christen, MD 06/22/16 1441

## 2016-06-18 NOTE — ED Triage Notes (Signed)
Pt complains of right elbow pain and bruising to knee since falling this morning. Pt states she tripped. Pt denies head injury or loss of consciousness. Pt took tylenol at 11PM, states she hurts "a little bit".

## 2016-06-18 NOTE — ED Provider Notes (Deleted)
[facility] DEPT Provider Note   CSN: [id number] Arrival date & time: 06/18/16  1129     History   Chief Complaint Chief Complaint  Patient presents with  . Elbow Pain    HPI Emily Heath is a 82 y.o. female.  This is an 82 year old female lives by herself.  She does have a caregiver that comes in for 8 hours a day.  Son states that he was with her from 9-11 last night, which is the last time she was seen at her normal state of health when he went to check on her this morning, she was found lying on the floor in the bathroom in her pajamas facedown.  She was assisted to her feet.  She has been able to ambulate with assistance, since she's complaining of right elbow pain.  He did notice when he changed her close that she had an abrasion to the lateral malleolus and lateral aspect of her knee on the right.  Her right cheek is also reddened.  He states that this is the side of the face that was on the floor.  Patient is unable to contribute to her history  or time of fall or what made her fall      Past Medical History:  Diagnosis Date  . Anxiety   . Depression   . Diabetes mellitus without complication ([place])   . Hyperlipemia   . Hypertension     Patient Active Problem List   Diagnosis Date Noted  . Cough   . Fall   . Altered mental status 06/18/2016  . Syncope and collapse 06/18/2016  . Dementia with behavioral disturbance 05/12/2016  . ONYCHOMYCOSIS 12/19/2006  . HYPERLIPIDEMIA 12/19/2006  . MENINGIOMA 10/04/2006  . HYPOTHYROIDISM 10/04/2006  . OSTEOPENIA 10/04/2006    Past Surgical History:  Procedure Laterality Date  . no surgerical history      OB History    No data available       Home Medications    Prior to Admission medications   Medication Sig Start Date End Date Taking? Authorizing Provider  acetaminophen (TYLENOL) 325 MG tablet Take 650 mg by mouth every 6 (six) hours as needed for moderate pain.   Yes Historical Provider, MD  donepezil  (ARICEPT) 10 MG tablet Take 10 mg by mouth daily.  05/29/16  Yes Historical Provider, MD  levothyroxine (SYNTHROID, LEVOTHROID) 25 MCG tablet Take 25 mcg by mouth daily before breakfast.  05/29/16  Yes Historical Provider, MD  losartan (COZAAR) 50 MG tablet Take 50 mg by mouth daily.  05/29/16  Yes Historical Provider, MD  Vitamin D, Ergocalciferol, (DRISDOL) 50000 units CAPS capsule TAKE ONE CAPSULE BY MOUTH EVERY WEEK. [place] WITH LOSARTAN AND DONEPEZIL 05/29/16   Historical Provider, MD    Family History Family History  Problem Relation Age of Onset  . Cancer Mother   . Alzheimer's disease Neg Hx     Social History Social History  Substance Use Topics  . Smoking status: Never Smoker  . Smokeless tobacco: Never Used  . Alcohol use No     Allergies   Patient has no known allergies.   Review of Systems Review of Systems  Unable to perform ROS: Dementia  Constitutional: Negative.  Negative for fever.  Neurological: Negative for dizziness and headaches.  All other systems reviewed and are negative.    Physical Exam Updated Vital Signs BP (!) 140/53 (BP Location: Left Arm)   Pulse 84   Temp 97.5 F (36.4  C) (Oral)   Resp 20   Ht 4\' 10"  (1.473 m)   Wt 137 lb 9.1 oz (62.4 kg)   SpO2 93%   BMI 28.75 kg/m   Physical Exam  HENT:  Head: Normocephalic.    Eyes: Pupils are equal, round, and reactive to light.  Neck: Normal range of motion.  Cardiovascular: Normal rate.   Pulmonary/Chest: Effort normal and breath sounds normal. She exhibits no tenderness.  Abdominal: Soft. Bowel sounds are normal. She exhibits no distension. There is no tenderness.  Musculoskeletal: Normal range of motion. She exhibits tenderness.       Right elbow: She exhibits normal range of motion.       Arms:      Legs: Nursing note and vitals reviewed.    ED Treatments / Results  Labs (all labs ordered are listed, but only abnormal results are displayed) Labs Reviewed    URINALYSIS, ROUTINE W REFLEX MICROSCOPIC - Abnormal; Notable for the following:       Result Value   Glucose, UA 50 (*)    Hgb urine dipstick LARGE (*)    Ketones, ur 5 (*)    Protein, ur 30 (*)    All other components within normal limits  CK TOTAL AND CKMB (NOT AT [facility]) - Abnormal; Notable for the following:    Total CK 3,903 (*)    CK, MB 27.1 (*)    All other components within normal limits  CBC WITH DIFFERENTIAL/PLATELET - Abnormal; Notable for the following:    Lymphs Abs 0.3 (*)    All other components within normal limits  HEMOGLOBIN A1C - Abnormal; Notable for the following:    Hgb A1c MFr Bld 6.0 (*)    All other components within normal limits  CK TOTAL AND CKMB (NOT AT [facility]) - Abnormal; Notable for the following:    Total CK 4,414 (*)    CK, MB 12.2 (*)    All other components within normal limits  COMPREHENSIVE METABOLIC PANEL - Abnormal; Notable for the following:    Potassium 3.4 (*)    Glucose, Bld 129 (*)    BUN 21 (*)    Calcium 8.5 (*)    Albumin 3.4 (*)    AST 66 (*)    All other components within normal limits  CBC - Abnormal; Notable for the following:    Hemoglobin 11.8 (*)    HCT 35.5 (*)    All other components within normal limits  TROPONIN I - Abnormal; Notable for the following:    Troponin I 0.04 (*)    All other components within normal limits  TROPONIN I - Abnormal; Notable for the following:    Troponin I 0.04 (*)    All other components within normal limits  TROPONIN I - Abnormal; Notable for the following:    Troponin I 0.03 (*)    All other components within normal limits  GLUCOSE, CAPILLARY - Abnormal; Notable for the following:    Glucose-Capillary 120 (*)    All other components within normal limits  COMPREHENSIVE METABOLIC PANEL - Abnormal; Notable for the following:    Glucose, Bld 106 (*)    Calcium 8.4 (*)    Total Protein 6.0 (*)    Albumin 3.3 (*)    AST 63 (*)    All other components within normal limits  CK -  Abnormal; Notable for the following:    Total CK 3,150 (*)    All other components within normal limits  I-STAT CHEM 8, ED - Abnormal; Notable for the following:    Glucose, Bld 122 (*)    Calcium, Ion 1.06 (*)    All other components within normal limits  URINE CULTURE  URINE CULTURE  RAPID URINE DRUG SCREEN, HOSP PERFORMED  CREATININE, SERUM  TSH  T4, FREE  CBC  VITAMIN B12  GLUCOSE, CAPILLARY  I-STAT TROPOININ, ED    EKG  EKG Interpretation  Date/Time:  Monday June 18 2016 13:35:56 EST Ventricular Rate:  79 PR Interval:    QRS Duration: 80 QT Interval:  412 QTC Calculation: 473 R Axis:   11 Text Interpretation:  Sinus rhythm Abnormal R-wave progression, early transition Borderline T abnormalities, inferior leads Confirmed by Lacinda Axon  MD, Briarrose Shor ([id number]) on 06/18/2016 1:55:09 PM       Radiology Dg Chest 2 View  Result Date: 06/19/2016 CLINICAL DATA:  Cough EXAM: CHEST  2 VIEW COMPARISON:  01/21/2015 FINDINGS: Cardiomediastinal silhouette is stable. Elevation of the left hemidiaphragm again noted. No infiltrate or pulmonary edema. Osteopenia and degenerative changes thoracic spine. IMPRESSION: No infiltrate or pulmonary edema. Chronic elevation of the left hemidiaphragm. Degenerative changes thoracic spine. Electronically Signed   By: Lahoma Crocker M.D.   On: 06/19/2016 11:18   Dg Pelvis 1-2 Views  Result Date: 06/18/2016 CLINICAL DATA:  Status post fall.  Right-sided pain. EXAM: PELVIS - 1-2 VIEW COMPARISON:  None. FINDINGS: There is no evidence of pelvic fracture or diastasis. No pelvic bone lesions are seen. IMPRESSION: Negative. Electronically Signed   By: Fidela Salisbury M.D.   On: 06/18/2016 13:34   Ct Head Wo Contrast  Result Date: 06/18/2016 CLINICAL DATA:  Fall this morning EXAM: CT HEAD WITHOUT CONTRAST CT CERVICAL SPINE WITHOUT CONTRAST TECHNIQUE: Multidetector CT imaging of the head and cervical spine was performed following the standard protocol without  intravenous contrast. Multiplanar CT image reconstructions of the cervical spine were also generated. COMPARISON:  10/16/2005 FINDINGS: CT HEAD FINDINGS Brain: Mild chronic ischemic changes in the periventricular white matter. Small calcified extra-axial mass over the right parietal lobe is stable. There is no mass effect, midline shift, or acute hemorrhage. Vascular: No hyperdense vessel or unexpected calcification. Skull: Cranium is intact. Sinuses/Orbits: There is an air-fluid level in the left maxillary sinus. There is mucosal thickening in the ethmoid air cells. Mastoid air cells are clear. Sphenoid sinuses clear. Other: Noncontributory CT CERVICAL SPINE FINDINGS Alignment: Anatomic alignment. Skull base and vertebrae: No acute fracture. No primary bone lesion or focal pathologic process. Soft tissues and spinal canal: No prevertebral fluid or swelling. No visible canal hematoma. Disc levels: In no evidence of spinal stenosis. Left-sided facet arthropathy occurs throughout the upper and mid cervical spine. Upper chest: Negative. Other: Noncontributory IMPRESSION: No acute intracranial pathology. No evidence of cervical spine injury. Electronically Signed   By: Marybelle Killings M.D.   On: 06/18/2016 14:13   Ct Cervical Spine Wo Contrast  Result Date: 06/18/2016 CLINICAL DATA:  Fall this morning EXAM: CT HEAD WITHOUT CONTRAST CT CERVICAL SPINE WITHOUT CONTRAST TECHNIQUE: Multidetector CT imaging of the head and cervical spine was performed following the standard protocol without intravenous contrast. Multiplanar CT image reconstructions of the cervical spine were also generated. COMPARISON:  10/16/2005 FINDINGS: CT HEAD FINDINGS Brain: Mild chronic ischemic changes in the periventricular white matter. Small calcified extra-axial mass over the right parietal lobe is stable. There is no mass effect, midline shift, or acute hemorrhage. Vascular: No hyperdense vessel or unexpected calcification. Skull: Cranium is  intact. Sinuses/Orbits:  There is an air-fluid level in the left maxillary sinus. There is mucosal thickening in the ethmoid air cells. Mastoid air cells are clear. Sphenoid sinuses clear. Other: Noncontributory CT CERVICAL SPINE FINDINGS Alignment: Anatomic alignment. Skull base and vertebrae: No acute fracture. No primary bone lesion or focal pathologic process. Soft tissues and spinal canal: No prevertebral fluid or swelling. No visible canal hematoma. Disc levels: In no evidence of spinal stenosis. Left-sided facet arthropathy occurs throughout the upper and mid cervical spine. Upper chest: Negative. Other: Noncontributory IMPRESSION: No acute intracranial pathology. No evidence of cervical spine injury. Electronically Signed   By: Marybelle Killings M.D.   On: 06/18/2016 14:13    Procedures Procedures (including critical care time)  Medications Ordered in ED Medications  levothyroxine (SYNTHROID, LEVOTHROID) tablet 25 mcg (25 mcg Oral Given 06/20/16 0813)  Vitamin D (Ergocalciferol) (DRISDOL) capsule 50,000 Units (not administered)  sodium chloride flush (NS) 0.9 % injection 3 mL (3 mLs Intravenous Not Given 06/20/16 0908)  heparin injection 5,000 Units (5,000 Units Subcutaneous Given 06/20/16 0641)  acetaminophen (TYLENOL) tablet 650 mg (not administered)    Or  acetaminophen (TYLENOL) suppository 650 mg (not administered)  traMADol (ULTRAM) tablet 50 mg (50 mg Oral Given 06/20/16 0012)  senna-docusate (Senokot-S) tablet 1 tablet (not administered)  ondansetron (ZOFRAN) tablet 4 mg (not administered)    Or  ondansetron (ZOFRAN) injection 4 mg (not administered)  0.9 % NaCl with KCl 20 mEq/ L  infusion ( Intravenous Rate/Dose Change 06/20/16 1235)  haloperidol lactate (HALDOL) injection 0.5 mg (0 mg Intravenous Hold 06/20/16 0145)  cyanocobalamin ((VITAMIN B-12)) injection 1,000 mcg (not administered)  potassium chloride SA (K-DUR,KLOR-CON) CR tablet 40 mEq (40 mEq Oral Given 06/19/16 1842)      Initial Impression / Assessment and Plan / ED Course  I have reviewed the triage vital signs and the nursing notes.  Pertinent labs & imaging results that were available during my care of the patient were reviewed by me and considered in my medical decision making (see chart for details).  Clinical Course    Status post syncopal event and fall. Patient is unable to care for self. Will admit to telemetry.    Final Clinical Impressions(s) / ED Diagnoses   Final diagnoses:  Altered mental status, unspecified altered mental status type  Fall, initial encounter  Contusion of right elbow, initial encounter  Abrasion    New Prescriptions Current Discharge Medication List       Junius Creamer, NP 06/18/16 1614 Medical screening examination/treatment/procedure(s) were conducted as a shared visit with non-physician practitioner(s) and myself.  I personally evaluated the patient during the encounter.   EKG Interpretation  Date/Time:  Monday June 18 2016 13:35:56 EST Ventricular Rate:  79 PR Interval:    QRS Duration: 80 QT Interval:  412 QTC Calculation: 473 R Axis:   11 Text Interpretation:  Sinus rhythm Abnormal R-wave progression, early transition Borderline T abnormalities, inferior leads Confirmed by Lacinda Axon  MD, Rivaan Kendall ([id number]) on 06/18/2016 1:55:09 PM      Medical screening examination/treatment/procedure(s) were conducted as a shared visit with non-physician practitioner(s) and myself.  I personally evaluated the patient during the encounter.   EKG Interpretation  Date/Time:  Monday June 18 2016 13:35:56 EST Ventricular Rate:  79 PR Interval:    QRS Duration: 80 QT Interval:  412 QTC Calculation: 473 R Axis:   11 Text Interpretation:  Sinus rhythm Abnormal R-wave progression, early transition Borderline T abnormalities, inferior leads Confirmed by Lacinda Axon  MD, Trynity Skousen (  [id number]) on 06/18/2016 1:55:09 PM     Status post syncopal event with fall. Patient is unable  to care for self. Will admit to telemetry.   Nat Christen, MD 06/20/16 1248

## 2016-06-19 ENCOUNTER — Observation Stay (HOSPITAL_BASED_OUTPATIENT_CLINIC_OR_DEPARTMENT_OTHER): Payer: Medicare Other

## 2016-06-19 ENCOUNTER — Observation Stay: Payer: Medicare Other

## 2016-06-19 ENCOUNTER — Observation Stay (HOSPITAL_BASED_OUTPATIENT_CLINIC_OR_DEPARTMENT_OTHER)
Admit: 2016-06-19 | Discharge: 2016-06-19 | Disposition: A | Discharge status: Home / Self Care | Payer: Medicare Other | Attending: Internal Medicine | Admitting: Internal Medicine

## 2016-06-19 ENCOUNTER — Observation Stay (HOSPITAL_COMMUNITY): Payer: Medicare Other

## 2016-06-19 DIAGNOSIS — R400 Somnolence: Secondary | ICD-10-CM | POA: Diagnosis not present

## 2016-06-19 DIAGNOSIS — T148XXA Other injury of unspecified body region, initial encounter: Secondary | ICD-10-CM | POA: Diagnosis not present

## 2016-06-19 DIAGNOSIS — R05 Cough: Secondary | ICD-10-CM | POA: Diagnosis present

## 2016-06-19 DIAGNOSIS — R4182 Altered mental status, unspecified: Secondary | ICD-10-CM

## 2016-06-19 DIAGNOSIS — R059 Cough, unspecified: Secondary | ICD-10-CM

## 2016-06-19 DIAGNOSIS — E785 Hyperlipidemia, unspecified: Secondary | ICD-10-CM | POA: Diagnosis present

## 2016-06-19 DIAGNOSIS — E039 Hypothyroidism, unspecified: Secondary | ICD-10-CM | POA: Diagnosis present

## 2016-06-19 DIAGNOSIS — Z79899 Other long term (current) drug therapy: Secondary | ICD-10-CM | POA: Diagnosis not present

## 2016-06-19 DIAGNOSIS — W19XXXA Unspecified fall, initial encounter: Secondary | ICD-10-CM

## 2016-06-19 DIAGNOSIS — Z66 Do not resuscitate: Secondary | ICD-10-CM | POA: Diagnosis present

## 2016-06-19 DIAGNOSIS — I10 Essential (primary) hypertension: Secondary | ICD-10-CM | POA: Diagnosis present

## 2016-06-19 DIAGNOSIS — E538 Deficiency of other specified B group vitamins: Secondary | ICD-10-CM | POA: Diagnosis present

## 2016-06-19 DIAGNOSIS — M6282 Rhabdomyolysis: Secondary | ICD-10-CM | POA: Diagnosis present

## 2016-06-19 DIAGNOSIS — S5001XA Contusion of right elbow, initial encounter: Secondary | ICD-10-CM | POA: Diagnosis not present

## 2016-06-19 DIAGNOSIS — R55 Syncope and collapse: Secondary | ICD-10-CM | POA: Diagnosis present

## 2016-06-19 DIAGNOSIS — E119 Type 2 diabetes mellitus without complications: Secondary | ICD-10-CM | POA: Diagnosis present

## 2016-06-19 DIAGNOSIS — E876 Hypokalemia: Secondary | ICD-10-CM | POA: Diagnosis present

## 2016-06-19 DIAGNOSIS — G9341 Metabolic encephalopathy: Secondary | ICD-10-CM | POA: Diagnosis present

## 2016-06-19 DIAGNOSIS — Y92009 Unspecified place in unspecified non-institutional (private) residence as the place of occurrence of the external cause: Secondary | ICD-10-CM | POA: Diagnosis not present

## 2016-06-19 DIAGNOSIS — F0390 Unspecified dementia without behavioral disturbance: Secondary | ICD-10-CM | POA: Diagnosis present

## 2016-06-19 LAB — CK TOTAL AND CKMB (NOT AT ARMC)
CK, MB: 12.2 ng/mL — ABNORMAL HIGH (ref 0.5–5.0)
Relative Index: 0.3 (ref 0.0–2.5)
Total CK: 4414 U/L — ABNORMAL HIGH (ref 38–234)

## 2016-06-19 LAB — COMPREHENSIVE METABOLIC PANEL
ALBUMIN: 3.4 g/dL — AB (ref 3.5–5.0)
ALK PHOS: 46 U/L (ref 38–126)
ALT: 29 U/L (ref 14–54)
AST: 66 U/L — ABNORMAL HIGH (ref 15–41)
Anion gap: 8 (ref 5–15)
BUN: 21 mg/dL — ABNORMAL HIGH (ref 6–20)
CALCIUM: 8.5 mg/dL — AB (ref 8.9–10.3)
CHLORIDE: 106 mmol/L (ref 101–111)
CO2: 24 mmol/L (ref 22–32)
Creatinine, Ser: 0.64 mg/dL (ref 0.44–1.00)
GFR calc Af Amer: >60 mL/min (ref >60)
GFR calc non Af Amer: >60 mL/min (ref >60)
GLUCOSE: 129 mg/dL — AB (ref 65–99)
Potassium: 3.4 mmol/L — ABNORMAL LOW (ref 3.5–5.1)
Sodium: 138 mmol/L (ref 135–145)
Total Bilirubin: 0.9 mg/dL (ref 0.3–1.2)
Total Protein: 6.6 g/dL (ref 6.5–8.1)

## 2016-06-19 LAB — CBC
HCT: 35.5 % — ABNORMAL LOW (ref 36.0–46.0)
HEMOGLOBIN: 11.8 g/dL — AB (ref 12.0–15.0)
MCH: 29.7 pg (ref 26.0–34.0)
MCHC: 33.2 g/dL (ref 30.0–36.0)
MCV: 89.4 fL (ref 78.0–100.0)
Platelets: 222 10*3/uL (ref 150–400)
RBC: 3.97 MIL/uL (ref 3.87–5.11)
RDW: 13.5 % (ref 11.5–15.5)
WBC: 6.7 10*3/uL (ref 4.0–10.5)

## 2016-06-19 LAB — TROPONIN I: TROPONIN I: 0.03 ng/mL — AB (ref <0.03)

## 2016-06-19 LAB — ECHOCARDIOGRAM COMPLETE
Height: 58 in
Weight: 2148.16 oz

## 2016-06-19 LAB — T4, FREE: Free T4: 0.8 ng/dL (ref 0.61–1.12)

## 2016-06-19 LAB — URINE CULTURE: Culture: NO GROWTH

## 2016-06-19 LAB — GLUCOSE, CAPILLARY: Glucose-Capillary: 120 mg/dL — ABNORMAL HIGH (ref 65–99)

## 2016-06-19 MED ORDER — POTASSIUM CHLORIDE CRYS ER 20 MEQ PO TBCR
40.0000 meq | EXTENDED_RELEASE_TABLET | Freq: Once | ORAL | Status: AC
  Administered 2016-06-19: 40 meq via ORAL
  Filled 2016-06-19: qty 2

## 2016-06-19 MED ORDER — POTASSIUM CHLORIDE IN NACL 20-0.9 MEQ/L-% IV SOLN
INTRAVENOUS | Status: DC
  Administered 2016-06-19 – 2016-06-20 (×3): via INTRAVENOUS
  Filled 2016-06-19 (×4): qty 1000

## 2016-06-19 NOTE — Progress Notes (Signed)
  Echocardiogram 2D Echocardiogram has been performed.  Tresa Res 06/19/2016, 10:10 AM

## 2016-06-19 NOTE — Evaluation (Signed)
Physical Therapy Evaluation Patient Details Name: Emily Heath MRN: [id number] DOB: April 28, 1930 Today's Date: 06/19/2016   History of Present Illness  82 y.o. female with medical history significant of HTN, HLD, Diabetes Mellitus, Hypothyroidism, Dementia who presented from home with Confusion and Unwitnessed Fall and unknown time of laying on the floor. Dx of mild rhabdomyosysis, acute encephalopathy  Clinical Impression  Pt admitted with above diagnosis. Pt currently with functional limitations due to the deficits listed below (see PT Problem List). Mod assist for bed to recliner transfer. Pt will need 24* assistance upon acute DC. Presently has private aide 7 hours/day. SNF vs HHPT with 24* assistance recommended.  Pt will benefit from skilled PT to increase their independence and safety with mobility to allow discharge to the venue listed below.       Follow Up Recommendations Supervision/Assistance - 24 hour;SNF    Equipment Recommendations  Rolling walker with 5" wheels    Recommendations for Other Services       Precautions / Restrictions Precautions Precautions: Fall Restrictions Weight Bearing Restrictions: No      Mobility  Bed Mobility Overal bed mobility: Needs Assistance Bed Mobility: Supine to Sit     Supine to sit: Mod assist     General bed mobility comments: mod A to raise trunk   Transfers Overall transfer level: Needs assistance Equipment used: 1 person hand held assist Transfers: Sit to/from [facility] Sit to Stand: Mod assist Stand pivot transfers: Mod assist       General transfer comment: assist to rise and to pivot, increased time, pivoted bed to [facility] then to recliner, difficulty weight shifting  Ambulation/Gait                Stairs            Wheelchair Mobility    Modified Rankin (Stroke Patients Only)       Balance Overall balance assessment: Needs assistance   Sitting balance-Leahy Scale: Fair     Standing balance support: Single extremity supported Standing balance-Leahy Scale: Poor                               Pertinent Vitals/Pain Pain Assessment: Faces Faces Pain Scale: Hurts little more Pain Location: left lower quadrant Pain Descriptors / Indicators: Sore Pain Intervention(s): Monitored during session;Limited activity within patient's tolerance    Home Living Family/patient expects to be discharged to:: Private residence Living Arrangements: Alone Available Help at Discharge: Personal care attendant;Available PRN/intermittently (aide comes 7 hours/day M-F, 4 hrs on weekends)         Home Layout: One level Home Equipment: None Additional Comments: limited PLOF provided by private aide who speaks limited English    Prior Function Level of Independence: Needs assistance   Gait / Transfers Assistance Needed: holds onto caregiver to walk  ADL's / Homemaking Assistance Needed: assist for ADLs        Hand Dominance        Extremity/Trunk Assessment   Upper Extremity Assessment Upper Extremity Assessment: Defer to OT evaluation    Lower Extremity Assessment Lower Extremity Assessment: Overall WFL for tasks assessed    Cervical / Trunk Assessment Cervical / Trunk Assessment: Normal  Communication   Communication: No difficulties  Cognition Arousal/Alertness: Awake/alert Behavior During Therapy: Flat affect Overall Cognitive Status: History of cognitive impairments - at baseline  General Comments: h/o dementia    General Comments      Exercises     Assessment/Plan    PT Assessment Patient needs continued PT services  PT Problem List Decreased activity tolerance;Decreased balance;Decreased mobility;Pain          PT Treatment Interventions Gait training;DME instruction;Therapeutic exercise;Functional mobility training;Therapeutic activities;Patient/family education    PT Goals (Current goals can be found in  the Care Plan section)  Acute Rehab PT Goals Patient Stated Goal: none stated PT Goal Formulation: With patient Time For Goal Achievement: 07/03/16 Potential to Achieve Goals: Fair    Frequency Min 3X/week   Barriers to discharge        Co-evaluation               End of Session Equipment Utilized During Treatment: Gait belt Activity Tolerance: Patient tolerated treatment well;No increased pain Patient left: in chair;with call bell/phone within reach;with chair alarm set;with family/visitor present Nurse Communication: Mobility status    Functional Assessment Tool Used: clinical judgement Functional Limitation: Mobility: Walking and moving around Mobility: Walking and Moving Around Current Status [id number]): At least 40 percent but less than 60 percent impaired, limited or restricted Mobility: Walking and Moving Around Goal Status [phone]): At least 20 percent but less than 40 percent impaired, limited or restricted    Time: 0922-0942 PT Time Calculation (min) (ACUTE ONLY): 20 min   Charges:   PT Evaluation $PT Eval Low Complexity: 1 Procedure     PT G Codes:   PT G-Codes **NOT FOR INPATIENT CLASS** Functional Assessment Tool Used: clinical judgement Functional Limitation: Mobility: Walking and moving around Mobility: Walking and Moving Around Current Status [id number]): At least 40 percent but less than 60 percent impaired, limited or restricted Mobility: Walking and Moving Around Goal Status [phone]): At least 20 percent but less than 40 percent impaired, limited or restricted    Philomena Doheny 06/19/2016, 9:52 AM [phone]

## 2016-06-19 NOTE — Progress Notes (Signed)
EEG completed; results pending.    

## 2016-06-19 NOTE — Progress Notes (Addendum)
[facility] PROGRESS NOTE  Emily Heath [id number] DOB: 08/18/1929 DOA: 06/18/2016   PCP: Idamae Schuller, MD     Assessment/Plan: Active Problems:   Altered mental status   Syncope and collapse   Cough   Fall   82 y.o. female with medical history significant of HTN, HLD, Diabetes Mellitus, Hypothyroidism, Dementia, and other comorbids who presented from home with Confusion and Unwitnessed Fall and unknown time of laying on the floor. Found to be in rhabdomyolysis   Assessment/Plan   Acute Metabolic Encephalopathy on Concomitant Dementia -Head CT Showed No acute intracranial pathology. No evidence of cervical spine injury. -UDS Negative -Urinalysis showed Negative Leukocytes, Negative Nitrites and 0-5 WBC; Urine Cx Pending -Patient still confused but able to follow commands and answer questions; ?whether worsening Dementia -Swallow Screen by SLP prior to being given a diet. -Hold Donepezil CT head wo contrast shows no acute pathology  TSH with NL EEG negative patient has had a slight cough for last 3 days MRI to r/o CVA  Steady decline since summer this year   Cough DG chest x-ray negative Patient is 100% on room air   Syncope with Unwitnessed Fall -Head CT Negative -Patient does not know how she ended up on the floor and unknown how long she was laying on floor; There is no evidence of pelvic fracture or diastasis. No pelvic bone lesions are seen on Pelvis X-Ray and Elbow X-Ray showed Radiocapitellar degenerative changes. No acute bony abnormalities. -ECHOcardiogram LV EF: 55% -   60% -Telemetry and Neuro Checks -Hold Sedative Medications and Donepezil  -Troponin I x 3 -PT/OT to Evaluate and Treat Check vitamin B12    Mild Rhabdomyolysis from laying on floor from Fall -CK was [id number]  and CK MB was 27.1; Troponin I poc was 0.03 -Urinalysis showed 30 RBC/HPF and Large Hb on Urine Dipstick -continue IV Hydration    Hypothyroidism    TSH and Free T4 is NL -C/w Levothyroxine 25 mcg po Daily  Diabetes Mellitus Type 2  -Patient's BS was 122 on Admission -Check HbA1c -Continue to Monitor and if necessary place on SSI  Hypertension -Hold Losartan because of Rhabdo -Continue to Monitor BP's -Add Hydralazine As needed  DVT prophylaxis: Heparin sq Code Status: DO NOT RESUSCITATE    Family Communication: Discussed in detail with the patient/caregiver/son , all imaging results, lab results explained to the patient   Disposition Plan:  Dc 1-2 days      Consultants:  None   Procedures:  None   Antibiotics: Anti-infectives    None         HPI/Subjective: Patient is confused, caregivers by the bedside states that patient has had a cough since last Friday   Objective: Vitals:   06/18/16 2124 06/19/16 0156 06/19/16 0524 06/19/16 0558  BP: (!) 142/47 (!) 142/47  (!) 139/51  Pulse: 88 92  84  Resp: 16 20  20   Temp: 98.6 F (37 C) 98.7 F (37.1 C)  97.9 F (36.6 C)  TempSrc: Oral Oral  Oral  SpO2: 97% 95%  94%  Weight:   60.9 kg (134 lb 4.2 oz)   Height:        Intake/Output Summary (Last 24 hours) at 06/19/16 1205 Last data filed at 06/19/16 1000  Gross per 24 hour  Intake          1244.58 ml  Output                0 ml  Net          1244.58 ml    Exam:  Examination:  General exam: Appears calm and comfortable  Respiratory system: Clear to auscultation. Respiratory effort normal. Cardiovascular system: S1 & S2 heard, RRR. No JVD, murmurs, rubs, gallops or clicks. No pedal edema. Gastrointestinal system: Abdomen is nondistended, soft and nontender. No organomegaly or masses felt. Normal bowel sounds heard. Central nervous system: Alert and oriented. No focal neurological deficits. Extremities: Symmetric 5 x 5 power. Skin: No rashes, lesions or ulcers Psychiatry: Judgement and insight appear normal. Mood & affect appropriate.     Data Reviewed: I have personally reviewed following  labs and imaging studies  Micro Results No results found for this or any previous visit (from the past 240 hour(s)).  Radiology Reports Dg Chest 2 View  Result Date: 06/19/2016 CLINICAL DATA:  Cough EXAM: CHEST  2 VIEW COMPARISON:  01/21/2015 FINDINGS: Cardiomediastinal silhouette is stable. Elevation of the left hemidiaphragm again noted. No infiltrate or pulmonary edema. Osteopenia and degenerative changes thoracic spine. IMPRESSION: No infiltrate or pulmonary edema. Chronic elevation of the left hemidiaphragm. Degenerative changes thoracic spine. Electronically Signed   By: Lahoma Crocker M.D.   On: 06/19/2016 11:18   Dg Pelvis 1-2 Views  Result Date: 06/18/2016 CLINICAL DATA:  Status post fall.  Right-sided pain. EXAM: PELVIS - 1-2 VIEW COMPARISON:  None. FINDINGS: There is no evidence of pelvic fracture or diastasis. No pelvic bone lesions are seen. IMPRESSION: Negative. Electronically Signed   By: Fidela Salisbury M.D.   On: 06/18/2016 13:34   Dg Elbow Complete Right  Result Date: 06/18/2016 CLINICAL DATA:  RIGHT elbow pain post fall this morning EXAM: RIGHT ELBOW - COMPLETE 3+ VIEW COMPARISON:  None FINDINGS: Osseous demineralization. Radiocapitellar joint space narrowing and mild capitellar irregularity compatible with degenerative changes. Small olecranon spur. No acute fracture, dislocation, or bone destruction. No elbow joint effusion. IMPRESSION: Radiocapitellar degenerative changes. No acute bony abnormalities. Electronically Signed   By: Lavonia Dana M.D.   On: 06/18/2016 12:09   Ct Head Wo Contrast  Result Date: 06/18/2016 CLINICAL DATA:  Fall this morning EXAM: CT HEAD WITHOUT CONTRAST CT CERVICAL SPINE WITHOUT CONTRAST TECHNIQUE: Multidetector CT imaging of the head and cervical spine was performed following the standard protocol without intravenous contrast. Multiplanar CT image reconstructions of the cervical spine were also generated. COMPARISON:  10/16/2005 FINDINGS: CT  HEAD FINDINGS Brain: Mild chronic ischemic changes in the periventricular white matter. Small calcified extra-axial mass over the right parietal lobe is stable. There is no mass effect, midline shift, or acute hemorrhage. Vascular: No hyperdense vessel or unexpected calcification. Skull: Cranium is intact. Sinuses/Orbits: There is an air-fluid level in the left maxillary sinus. There is mucosal thickening in the ethmoid air cells. Mastoid air cells are clear. Sphenoid sinuses clear. Other: Noncontributory CT CERVICAL SPINE FINDINGS Alignment: Anatomic alignment. Skull base and vertebrae: No acute fracture. No primary bone lesion or focal pathologic process. Soft tissues and spinal canal: No prevertebral fluid or swelling. No visible canal hematoma. Disc levels: In no evidence of spinal stenosis. Left-sided facet arthropathy occurs throughout the upper and mid cervical spine. Upper chest: Negative. Other: Noncontributory IMPRESSION: No acute intracranial pathology. No evidence of cervical spine injury. Electronically Signed   By: Marybelle Killings M.D.   On: 06/18/2016 14:13   Ct Cervical Spine Wo Contrast  Result Date: 06/18/2016 CLINICAL DATA:  Fall this morning EXAM: CT HEAD WITHOUT CONTRAST CT CERVICAL SPINE WITHOUT CONTRAST TECHNIQUE: Multidetector CT  imaging of the head and cervical spine was performed following the standard protocol without intravenous contrast. Multiplanar CT image reconstructions of the cervical spine were also generated. COMPARISON:  10/16/2005 FINDINGS: CT HEAD FINDINGS Brain: Mild chronic ischemic changes in the periventricular white matter. Small calcified extra-axial mass over the right parietal lobe is stable. There is no mass effect, midline shift, or acute hemorrhage. Vascular: No hyperdense vessel or unexpected calcification. Skull: Cranium is intact. Sinuses/Orbits: There is an air-fluid level in the left maxillary sinus. There is mucosal thickening in the ethmoid air cells.  Mastoid air cells are clear. Sphenoid sinuses clear. Other: Noncontributory CT CERVICAL SPINE FINDINGS Alignment: Anatomic alignment. Skull base and vertebrae: No acute fracture. No primary bone lesion or focal pathologic process. Soft tissues and spinal canal: No prevertebral fluid or swelling. No visible canal hematoma. Disc levels: In no evidence of spinal stenosis. Left-sided facet arthropathy occurs throughout the upper and mid cervical spine. Upper chest: Negative. Other: Noncontributory IMPRESSION: No acute intracranial pathology. No evidence of cervical spine injury. Electronically Signed   By: Marybelle Killings M.D.   On: 06/18/2016 14:13     CBC  Recent Labs Lab 06/18/16 1330 06/18/16 1343 06/18/16 1954 06/19/16 0451  WBC 7.0  --  8.4 6.7  HGB 12.9 12.9 13.0 11.8*  HCT 38.3 38.0 38.6 35.5*  PLT 228  --  199 222  MCV 88.7  --  89.8 89.4  MCH 29.9  --  30.2 29.7  MCHC 33.7  --  33.7 33.2  RDW 13.3  --  13.5 13.5  LYMPHSABS 0.3*  --   --   --   MONOABS 0.8  --   --   --   EOSABS 0.0  --   --   --   BASOSABS 0.0  --   --   --     Chemistries   Recent Labs Lab 06/18/16 1343 06/18/16 1818 06/19/16 0451  NA 140  --  138  K 3.5  --  3.4*  CL 101  --  106  CO2  --   --  24  GLUCOSE 122*  --  129*  BUN 19  --  21*  CREATININE 0.80 0.71 0.64  CALCIUM  --   --  8.5*  AST  --   --  66*  ALT  --   --  29  ALKPHOS  --   --  46  BILITOT  --   --  0.9   ------------------------------------------------------------------------------------------------------------------ estimated creatinine clearance is 40.4 mL/min (by C-G formula based on SCr of 0.64 mg/dL). ------------------------------------------------------------------------------------------------------------------ No results for input(s): HGBA1C in the last 72 hours. ------------------------------------------------------------------------------------------------------------------ No results for input(s): CHOL, HDL, LDLCALC,  TRIG, CHOLHDL, LDLDIRECT in the last 72 hours. ------------------------------------------------------------------------------------------------------------------  Recent Labs  06/18/16 1818  TSH 0.709   ------------------------------------------------------------------------------------------------------------------ No results for input(s): VITAMINB12, FOLATE, FERRITIN, TIBC, IRON, RETICCTPCT in the last 72 hours.  Coagulation profile No results for input(s): INR, PROTIME in the last 168 hours.  No results for input(s): DDIMER in the last 72 hours.  Cardiac Enzymes  Recent Labs Lab 06/18/16 1332 06/18/16 1818 06/18/16 2251 06/19/16 0451  CKMB 27.1*  --   --  12.2*  TROPONINI  --  0.04* 0.04* 0.03*   ------------------------------------------------------------------------------------------------------------------ Invalid input(s): POCBNP   CBG:  Recent Labs Lab 06/19/16 0531  GLUCAP 120*       Studies: Dg Chest 2 View  Result Date: 06/19/2016 CLINICAL DATA:  Cough EXAM: CHEST  2 VIEW  COMPARISON:  01/21/2015 FINDINGS: Cardiomediastinal silhouette is stable. Elevation of the left hemidiaphragm again noted. No infiltrate or pulmonary edema. Osteopenia and degenerative changes thoracic spine. IMPRESSION: No infiltrate or pulmonary edema. Chronic elevation of the left hemidiaphragm. Degenerative changes thoracic spine. Electronically Signed   By: Lahoma Crocker M.D.   On: 06/19/2016 11:18   Dg Pelvis 1-2 Views  Result Date: 06/18/2016 CLINICAL DATA:  Status post fall.  Right-sided pain. EXAM: PELVIS - 1-2 VIEW COMPARISON:  None. FINDINGS: There is no evidence of pelvic fracture or diastasis. No pelvic bone lesions are seen. IMPRESSION: Negative. Electronically Signed   By: Fidela Salisbury M.D.   On: 06/18/2016 13:34   Dg Elbow Complete Right  Result Date: 06/18/2016 CLINICAL DATA:  RIGHT elbow pain post fall this morning EXAM: RIGHT ELBOW - COMPLETE 3+ VIEW  COMPARISON:  None FINDINGS: Osseous demineralization. Radiocapitellar joint space narrowing and mild capitellar irregularity compatible with degenerative changes. Small olecranon spur. No acute fracture, dislocation, or bone destruction. No elbow joint effusion. IMPRESSION: Radiocapitellar degenerative changes. No acute bony abnormalities. Electronically Signed   By: Lavonia Dana M.D.   On: 06/18/2016 12:09   Ct Head Wo Contrast  Result Date: 06/18/2016 CLINICAL DATA:  Fall this morning EXAM: CT HEAD WITHOUT CONTRAST CT CERVICAL SPINE WITHOUT CONTRAST TECHNIQUE: Multidetector CT imaging of the head and cervical spine was performed following the standard protocol without intravenous contrast. Multiplanar CT image reconstructions of the cervical spine were also generated. COMPARISON:  10/16/2005 FINDINGS: CT HEAD FINDINGS Brain: Mild chronic ischemic changes in the periventricular white matter. Small calcified extra-axial mass over the right parietal lobe is stable. There is no mass effect, midline shift, or acute hemorrhage. Vascular: No hyperdense vessel or unexpected calcification. Skull: Cranium is intact. Sinuses/Orbits: There is an air-fluid level in the left maxillary sinus. There is mucosal thickening in the ethmoid air cells. Mastoid air cells are clear. Sphenoid sinuses clear. Other: Noncontributory CT CERVICAL SPINE FINDINGS Alignment: Anatomic alignment. Skull base and vertebrae: No acute fracture. No primary bone lesion or focal pathologic process. Soft tissues and spinal canal: No prevertebral fluid or swelling. No visible canal hematoma. Disc levels: In no evidence of spinal stenosis. Left-sided facet arthropathy occurs throughout the upper and mid cervical spine. Upper chest: Negative. Other: Noncontributory IMPRESSION: No acute intracranial pathology. No evidence of cervical spine injury. Electronically Signed   By: Marybelle Killings M.D.   On: 06/18/2016 14:13   Ct Cervical Spine Wo  Contrast  Result Date: 06/18/2016 CLINICAL DATA:  Fall this morning EXAM: CT HEAD WITHOUT CONTRAST CT CERVICAL SPINE WITHOUT CONTRAST TECHNIQUE: Multidetector CT imaging of the head and cervical spine was performed following the standard protocol without intravenous contrast. Multiplanar CT image reconstructions of the cervical spine were also generated. COMPARISON:  10/16/2005 FINDINGS: CT HEAD FINDINGS Brain: Mild chronic ischemic changes in the periventricular white matter. Small calcified extra-axial mass over the right parietal lobe is stable. There is no mass effect, midline shift, or acute hemorrhage. Vascular: No hyperdense vessel or unexpected calcification. Skull: Cranium is intact. Sinuses/Orbits: There is an air-fluid level in the left maxillary sinus. There is mucosal thickening in the ethmoid air cells. Mastoid air cells are clear. Sphenoid sinuses clear. Other: Noncontributory CT CERVICAL SPINE FINDINGS Alignment: Anatomic alignment. Skull base and vertebrae: No acute fracture. No primary bone lesion or focal pathologic process. Soft tissues and spinal canal: No prevertebral fluid or swelling. No visible canal hematoma. Disc levels: In no evidence of spinal stenosis. Left-sided facet  arthropathy occurs throughout the upper and mid cervical spine. Upper chest: Negative. Other: Noncontributory IMPRESSION: No acute intracranial pathology. No evidence of cervical spine injury. Electronically Signed   By: Marybelle Killings M.D.   On: 06/18/2016 14:13      No results found for: HGBA1C Lab Results  Component Value Date   CREATININE 0.64 06/19/2016       Scheduled Meds: . heparin  5,000 Units Subcutaneous Q8H  . levothyroxine  25 mcg Oral QAC breakfast  . sodium chloride flush  3 mL Intravenous Q12H  . [START ON 06/22/2016] Vitamin D (Ergocalciferol)  50,000 Units Oral Q7 days   Continuous Infusions: . 0.9 % NaCl with KCl 20 mEq / L 125 mL/hr at 06/19/16 0926     LOS: 0 days    Time  spent: >30 MINS    [facility]  [facility] Pager [phone]. If 7PM-7AM, please contact night-coverage at [email or web address], password [facility] 06/19/2016, 12:05 PM  LOS: 0 days

## 2016-06-19 NOTE — Evaluation (Signed)
Clinical/Bedside Swallow Evaluation Patient Details  Name: Dayany Tio MRN: [id number] Date of Birth: 1929-09-05  Today's Date: 06/19/2016 Time: SLP Start Time (ACUTE ONLY): 60 SLP Stop Time (ACUTE ONLY): 1025 SLP Time Calculation (min) (ACUTE ONLY): 20 min  Past Medical History:  Past Medical History:  Diagnosis Date  . Anxiety   . Depression   . Diabetes mellitus without complication ([place])   . Hyperlipemia   . Hypertension    Past Surgical History:  Past Surgical History:  Procedure Laterality Date  . no surgerical history     HPI:  82 year old female admitted 06/18/16 following a fall, confusion. PMH significant for HTN, HLD, DM, hypothyroid, dementia. Head CT reveals no acute findings. BSE ordered to evaluate swallow function and safety.   Assessment / Plan / Recommendation Clinical Impression  Pt was seen at bedside during breakfast. Caregiver was feeding pt, but reports pt is usually able to feed herself. Pt has adequate dentition (partials), and tolerates a regular diet at home per caregiver. Pt vocal intensity was noted to be significantly weak, however, volitional cough was strong. No overt s/s aspiration observed or reported. Will downgrade diet to dys 3 with chopped meats and thin liquids, primarily for energy conservation. ST will follow for assessment of diet tolerance and readiness to advance back to regular consistency solids.    Aspiration Risk  Mild aspiration risk    Diet Recommendation Dysphagia 3 (Mech soft);Thin liquid (chop meats)   Liquid Administration via: Cup Medication Administration: Whole meds with puree Supervision: Staff to assist with self feeding;Patient able to self feed;Full supervision/cueing for compensatory strategies Compensations: Minimize environmental distractions;Slow rate;Small sips/bites;Follow solids with liquid Postural Changes: Seated upright at 90 degrees;Remain upright for at least 30 minutes after po intake    Other   Recommendations Oral Care Recommendations: Oral care QID   Follow up Recommendations Skilled Nursing facility;24 hour supervision/assistance      Frequency and Duration min 1 x/week  2 weeks       Prognosis Prognosis for Safe Diet Advancement: Fair Barriers to Reach Goals: Cognitive deficits      Swallow Study   General Date of Onset: 06/18/16 HPI: 82 year old female admitted 06/18/16 following a fall, confusion. PMH significant for HTN, HLD, DM, hypothyroid, dementia. Head CT reveals no acute findings. BSE ordered to evaluate swallow function and safety. Type of Study: Bedside Swallow Evaluation Previous Swallow Assessment: none found Diet Prior to this Study: Regular;Thin liquids Temperature Spikes Noted: No Respiratory Status: Room air History of Recent Intubation: No Behavior/Cognition: Alert;Cooperative;Requires cueing Oral Cavity Assessment: Within Functional Limits Oral Care Completed by SLP: No Oral Cavity - Dentition:  (adequate dentition - has partials) Vision: Functional for self-feeding (caregiver reports pt usually feeds herself) Self-Feeding Abilities: Needs assist;Needs set up Patient Positioning: Upright in chair Baseline Vocal Quality: Low vocal intensity Volitional Cough: Weak Volitional Swallow: Able to elicit    Oral/Motor/Sensory Function Overall Oral Motor/Sensory Function: Within functional limits   Ice Chips Ice chips: Not tested   Thin Liquid Thin Liquid: Within functional limits Presentation: Cup;Straw    Nectar Thick Nectar Thick Liquid: Not tested   Honey Thick Honey Thick Liquid: Not tested   Puree Puree: Within functional limits Presentation: Spoon   Solid   GO   Solid: Impaired Oral Phase Functional Implications: Prolonged oral transit;Oral residue Other Comments: concern for aspiration risk with increased fatigue. Will downgrade diet for energy conservation.    Functional Assessment Tool Used: asha noms, clinical judgment,  BSE Functional Limitations: Swallowing Swallow Current Status [id number]): At least 20 percent but less than 40 percent impaired, limited or restricted Swallow Goal Status [phone]): At least 1 percent but less than 20 percent impaired, limited or restricted   Celia B. Mora, [place], [place]  Shonna Chock 06/19/2016,10:26 AM

## 2016-06-19 NOTE — Progress Notes (Signed)
OT Cancellation Note  Patient Details Name: Emily Heath MRN: [id number] DOB: [birth year]   Cancelled Treatment:    Reason Eval/Treat Not Completed: Fatigue/lethargy limiting ability to participate.  Pt back in bed and sleeping soundly.  [place], OTR/L [id number]   Lucille Passy M 06/19/2016, 11:57 AM

## 2016-06-19 NOTE — Procedures (Signed)
ELECTROENCEPHALOGRAM REPORT  Date of Study: 06/19/2016  Patient's Name: Emily Heath MRN: [id number] Date of Birth: 1929/08/02  Referring Provider: Dr. [name]  Clinical History: This is an 82 year old woman with altered mental status and fall.  Medications: levothyroxine (SYNTHROID, LEVOTHROID) tablet 25 mcg  ondansetron (ZOFRAN) tablet 4 mg  traMADol (ULTRAM) tablet 50 mg  Vitamin D (Ergocalciferol) (DRISDOL) capsule 50,000 Units   Technical Summary: A multichannel digital EEG recording measured by the international 10-20 system with electrodes applied with paste and impedances below 5000 ohms performed in our laboratory with EKG monitoring in an awake and asleep patient.  Hyperventilation and photic stimulation were not performed.  The digital EEG was referentially recorded, reformatted, and digitally filtered in a variety of bipolar and referential montages for optimal display.    Description: The patient is predominantly drowsy and asleep during the recording.  During brief period of wakefulness, there is a symmetric, medium voltage 9 Hz posterior dominant rhythm that attenuates with eye opening.  The record is symmetric.  During drowsiness and sleep, there is an increase in theta slowing of the background.  Vertex waves and symmetric sleep spindles were seen.  Hyperventilation and photic stimulation were not performed.  There were no epileptiform discharges or electrographic seizures seen.    EKG lead was unremarkable.  Impression: This predominantly drowsy and asleep EEG is normal.    Clinical Correlation: A normal EEG does not exclude a clinical diagnosis of epilepsy.  Clinical correlation is advised.   Ellouise Newer, M.D.

## 2016-06-19 NOTE — Consult Note (Signed)
SLP Cancellation Note  Patient Details Name: Emily Heath MRN: [id number] DOB: 1930-04-03   Cancelled treatment:        BSE ordered to evaluate swallow function and safety. Pt currently unavailable (with PT). Will continue efforts.  Leshae Mcclay B. Clark, [facility], [place]  Shonna Chock 06/19/2016, 9:23 AM

## 2016-06-20 DIAGNOSIS — R4182 Altered mental status, unspecified: Secondary | ICD-10-CM

## 2016-06-20 LAB — C DIFFICILE QUICK SCREEN W PCR REFLEX
C DIFFICILE (CDIFF) INTERP: NOT DETECTED
C DIFFICILE (CDIFF) TOXIN: NEGATIVE
C Diff antigen: NEGATIVE

## 2016-06-20 LAB — COMPREHENSIVE METABOLIC PANEL
ALBUMIN: 3.3 g/dL — AB (ref 3.5–5.0)
ALT: 30 U/L (ref 14–54)
AST: 63 U/L — AB (ref 15–41)
Alkaline Phosphatase: 40 U/L (ref 38–126)
Anion gap: 8 (ref 5–15)
BILIRUBIN TOTAL: 0.5 mg/dL (ref 0.3–1.2)
BUN: 13 mg/dL (ref 6–20)
CHLORIDE: 108 mmol/L (ref 101–111)
CO2: 25 mmol/L (ref 22–32)
Calcium: 8.4 mg/dL — ABNORMAL LOW (ref 8.9–10.3)
Creatinine, Ser: 0.58 mg/dL (ref 0.44–1.00)
GFR calc Af Amer: >60 mL/min (ref >60)
GFR calc non Af Amer: >60 mL/min (ref >60)
GLUCOSE: 106 mg/dL — AB (ref 65–99)
Potassium: 3.8 mmol/L (ref 3.5–5.1)
SODIUM: 141 mmol/L (ref 135–145)
TOTAL PROTEIN: 6 g/dL — AB (ref 6.5–8.1)

## 2016-06-20 LAB — VITAMIN B12: Vitamin B-12: 229 pg/mL (ref 180–914)

## 2016-06-20 LAB — GLUCOSE, CAPILLARY: Glucose-Capillary: 98 mg/dL (ref 65–99)

## 2016-06-20 LAB — CK: CK TOTAL: 3150 U/L — AB (ref 38–234)

## 2016-06-20 LAB — HEMOGLOBIN A1C
HEMOGLOBIN A1C: 6 % — AB (ref 4.8–5.6)
Mean Plasma Glucose: 126 mg/dL

## 2016-06-20 MED ORDER — CYANOCOBALAMIN 1000 MCG/ML IJ SOLN
1000.0000 ug | Freq: Every day | INTRAMUSCULAR | Status: AC
  Administered 2016-06-20 – 2016-06-21 (×2): 1000 ug via INTRAMUSCULAR
  Filled 2016-06-20 (×3): qty 1

## 2016-06-20 MED ORDER — HALOPERIDOL LACTATE 5 MG/ML IJ SOLN: 0.5000 mg | Freq: Once | INTRAMUSCULAR | Status: DC

## 2016-06-20 NOTE — Evaluation (Signed)
Occupational Therapy Evaluation Patient Details Name: Emily Heath MRN: [id number] DOB: July 19, 1929 Today's Date: 06/20/2016    History of Present Illness 82 y.o. female with medical history significant of HTN, HLD, Diabetes Mellitus, Hypothyroidism, Dementia who presented from home with Confusion and Unwitnessed Fall and unknown time of laying on the floor. Dx of mild rhabdomyosysis, acute encephalopathy   Clinical Impression   Pt admitted with above. She demonstrates the below listed deficits and will benefit from continued OT to maximize safety and independence with BADLs.  Pt presents to OT with impaired balance, impaired cognition, decreased activity tolerance, generalized weakness.  She requires mod - max A for ADLs and min - mod A for functional mobility.  Recommend SNF level rehab at discharge.        Follow Up Recommendations  SNF    Equipment Recommendations  None recommended by OT    Recommendations for Other Services       Precautions / Restrictions Precautions Precautions: Fall      Mobility Bed Mobility               General bed mobility comments: up in chair   Transfers Overall transfer level: Needs assistance Equipment used: 1 person hand held assist Transfers: Sit to/from Stand;Stand Pivot Transfers Sit to Stand: Min assist Stand pivot transfers: Mod assist       General transfer comment: assist to rise and for balance as well as assist to guide movement    Balance Overall balance assessment: Needs assistance Sitting-balance support: Feet supported Sitting balance-Leahy Scale: Fair     Standing balance support: Single extremity supported Standing balance-Leahy Scale: Poor                              ADL Overall ADL's : Needs assistance/impaired Eating/Feeding: Set up   Grooming: Wash/dry hands;Wash/dry face;Oral care;Brushing hair;Minimal assistance;Sitting   Upper Body Bathing: Moderate assistance;Sitting   Lower  Body Bathing: Maximal assistance;Sit to/from stand   Upper Body Dressing : Maximal assistance;Sitting   Lower Body Dressing: Total assistance;Sit to/from stand   Toilet Transfer: Moderate assistance;Stand-pivot;BSC Toilet Transfer Details (indicate cue type and reason): requires mod verbal cues.  Assist for balance and to guide movements  Toileting- Clothing Manipulation and Hygiene: Moderate assistance;Sit to/from stand       Functional mobility during ADLs: Minimal assistance;Moderate assistance General ADL Comments: pt fatigues quickly. Pt requires mod A for stand pivot transfers as she demonstrates difficulty problem solving through movement, but able to ambulate with min A HHA short distances      Vision     Perception     Praxis      Pertinent Vitals/Pain Pain Assessment: Faces Faces Pain Scale: No hurt     Hand Dominance     Extremity/Trunk Assessment Upper Extremity Assessment Upper Extremity Assessment: Generalized weakness   Lower Extremity Assessment Lower Extremity Assessment: Defer to PT evaluation   Cervical / Trunk Assessment Cervical / Trunk Assessment: Normal   Communication Communication Communication: No difficulties   Cognition Arousal/Alertness: Awake/alert Behavior During Therapy: Flat affect;WFL for tasks assessed/performed Overall Cognitive Status: History of cognitive impairments - at baseline                 General Comments: h/o dementia   General Comments       Exercises       Shoulder Instructions      Home Living Family/patient expects to be discharged to::  Skilled nursing facility Living Arrangements: Alone Available Help at Discharge: Personal care attendant;Available PRN/intermittently         Home Layout: One level               Home Equipment: None          Prior Functioning/Environment Level of Independence: Needs assistance  Gait / Transfers Assistance Needed: holds onto caregiver to  walk ADL's / Homemaking Assistance Needed: caregiver assists, but pt able to perform at least 50% of tasks             OT Problem List: Decreased strength;Decreased activity tolerance;Impaired balance (sitting and/or standing);Decreased cognition;Decreased safety awareness   OT Treatment/Interventions: Self-care/ADL training;Therapeutic exercise;DME and/or AE instruction;Therapeutic activities;Cognitive remediation/compensation;Patient/family education;Balance training    OT Goals(Current goals can be found in the care plan section) Acute Rehab OT Goals Patient Stated Goal: none stated OT Goal Formulation: With patient Time For Goal Achievement: 07/04/16 Potential to Achieve Goals: Good ADL Goals Pt Will Perform Grooming: with min assist;standing Pt Will Perform Upper Body Bathing: with supervision;with set-up;sitting Pt Will Perform Lower Body Bathing: with min assist;sit to/from stand Pt Will Perform Upper Body Dressing: sitting;with min assist Pt Will Perform Lower Body Dressing: sit to/from stand;with mod assist Pt Will Transfer to Toilet: with min assist;ambulating;regular height toilet;grab bars Pt Will Perform Toileting - Clothing Manipulation and hygiene: with min assist;sit to/from stand  OT Frequency: Min 2X/week   Barriers to D/C:            Co-evaluation              End of Session Nurse Communication: Mobility status  Activity Tolerance: Patient limited by fatigue Patient left: in chair;with call bell/phone within reach;with chair alarm set;with family/visitor present   Time: [id number] OT Time Calculation (min): 25 min Charges:  OT General Charges $OT Visit: 1 Procedure OT Evaluation $OT Eval Moderate Complexity: 1 Procedure OT Treatments $Self Care/Home Management : 8-22 mins G-Codes:    Anola Mcgough M 2016/07/07, 3:50 PM

## 2016-06-20 NOTE — Progress Notes (Signed)
Speech Language Pathology Treatment: Dysphagia  Patient Details Name: Emily Heath MRN: [id number] DOB: November 29, 1929 Today's Date: 06/20/2016 Time: 1201-1220 SLP Time Calculation (min) (ACUTE ONLY): 19 min  Assessment / Plan / Recommendation Clinical Impression  Skilled treatment with minimal verbal cueing provided while pt consumed Dysphagia 3 (soft) consistency and thin liquids via straw without overt s/s of aspiration noted; spoke with son re: dementia and swallowing s/s noted with BSE and recommendation of current diet of Dysphagia 3/thin with swallowing precautions explained in detail for safe consumption of po's d/t cognitive deficits noted.  Con't Dysphagia 3 (mech/soft) diet with thin liquids and ST will f/u while in house.     HPI HPI: 82 year old female admitted 06/18/16 following a fall, confusion. PMH significant for HTN, HLD, DM, hypothyroid, dementia. Head CT reveals no acute findings. BSE ordered to evaluate swallow function and safety.      SLP Plan  Continue with current plan of care     Recommendations  Diet recommendations: Dysphagia 3 (mechanical soft);Thin liquid Liquids provided via: Cup;Straw Medication Administration: Whole meds with puree Supervision: Patient able to self feed;Staff to assist with self feeding Compensations: Minimize environmental distractions;Slow rate;Small sips/bites;Follow solids with liquid                Oral Care Recommendations: Oral care QID Follow up Recommendations: Skilled Nursing facility;24 hour supervision/assistance Plan: Continue with current plan of care                       Clarine Elrod,PAT, M.S., CCC-SLP 06/20/2016, 1:35 PM

## 2016-06-20 NOTE — NC FL2 (Signed)
[place] LEVEL OF CARE SCREENING TOOL     IDENTIFICATION  Patient Name: Emily Heath Birthdate: 03/30/1930 Sex: female Admission Date (Current Location): 06/18/2016  [facility] and [place] Number:  Herbalist and Address:  [facility],  [place] [address], [place]      Provider Number: [id number]  Attending Physician Name and Address:  [name], MD  Relative Name and Phone Number:       Current Level of Care: Hospital Recommended Level of Care: [place] Prior Approval Number:    Date Approved/Denied:   PASRR Number: [id number]  Discharge Plan: SNF    Current Diagnoses: Patient Active Problem List   Diagnosis Date Noted  . Cough   . Fall   . Altered mental status 06/18/2016  . Syncope and collapse 06/18/2016  . Dementia with behavioral disturbance 05/12/2016  . ONYCHOMYCOSIS 12/19/2006  . HYPERLIPIDEMIA 12/19/2006  . MENINGIOMA 10/04/2006  . HYPOTHYROIDISM 10/04/2006  . OSTEOPENIA 10/04/2006    Orientation RESPIRATION BLADDER Height & Weight     Self, Time, Situation, Place  Normal Continent Weight: 137 lb 9.1 oz (62.4 kg) Height:  4\' 10"  (147.3 cm)  BEHAVIORAL SYMPTOMS/MOOD NEUROLOGICAL BOWEL NUTRITION STATUS      Continent Diet (Dys 3 Diet)  AMBULATORY STATUS COMMUNICATION OF NEEDS Skin   Extensive Assist Verbally Normal                       Personal Care Assistance Level of Assistance  Bathing, Dressing Bathing Assistance: Limited assistance   Dressing Assistance: Limited assistance     Functional Limitations Info             SPECIAL CARE FACTORS FREQUENCY  PT (By licensed PT), OT (By licensed OT)     PT Frequency: 5 OT Frequency: 5            Contractures      Additional Factors Info  Code Status, Allergies Code Status Info: DNR Allergies Info: NKDA           Current Medications (06/20/2016):  This is the current hospital active medication list Current  Facility-Administered Medications  Medication Dose Route Frequency Provider Last Rate Last Dose  . 0.9 % NaCl with KCl 20 mEq/ L  infusion   Intravenous Continuous [name], MD 75 mL/hr at 06/20/16 1235    . acetaminophen (TYLENOL) tablet 650 mg  650 mg Oral Q6H PRN Kerney Elbe, DO       Or  . acetaminophen (TYLENOL) suppository 650 mg  650 mg Rectal Q6H PRN Kerney Elbe, DO      . cyanocobalamin ((VITAMIN B-12)) injection 1,000 mcg  1,000 mcg Intramuscular Daily [name], MD   1,000 mcg at 06/20/16 1342  . haloperidol lactate (HALDOL) injection 0.5 mg  0.5 mg Intravenous Once Gardiner Barefoot, NP   Stopped at 06/20/16 0145  . heparin injection 5,000 Units  5,000 Units Subcutaneous [place], DO   5,000 Units at 06/20/16 1342  . levothyroxine (SYNTHROID, LEVOTHROID) tablet 25 mcg  25 mcg Oral QAC breakfast [place], DO   25 mcg at 06/20/16 0813  . ondansetron (ZOFRAN) tablet 4 mg  4 mg Oral Q6H PRN Kerney Elbe, DO       Or  . ondansetron (ZOFRAN) injection 4 mg  4 mg Intravenous Q6H PRN Omair Latif Sheikh, DO      . senna-docusate (Senokot-S) tablet 1 tablet  1 tablet Oral QHS PRN Bertram Savin Sheikh, DO      . sodium chloride flush (NS) 0.9 % injection 3 mL  3 mL Intravenous Q12H Omair Latif Sheikh, DO      . traMADol (ULTRAM) tablet 50 mg  50 mg Oral Q6H PRN Bertram Savin Sheikh, DO   50 mg at 06/20/16 0012  . [START ON 06/22/2016] Vitamin D (Ergocalciferol) (DRISDOL) capsule 50,000 Units  50,000 Units Oral Q7 days Kerney Elbe, DO         Discharge Medications: Please see discharge summary for a list of discharge medications.  Relevant Imaging Results:  Relevant Lab Results:   Additional Information SSN: [id number]  Standley Brooking, LCSW

## 2016-06-20 NOTE — Progress Notes (Signed)
Pt is very restless trying to get out of bed, she continues to pull a her equipment. Bed alarm is on she is currently a fall risk, Safety Monitoring Sitter is ordered at this time. Will continue to monitor.

## 2016-06-20 NOTE — Progress Notes (Signed)
[facility] PROGRESS NOTE  Emily Heath [id number] DOB: September 24, 1929 DOA: 06/18/2016   PCP: Idamae Schuller, MD     Assessment/Plan: Active Problems:   Altered mental status   Syncope and collapse   Cough   Fall   82 y.o. female with medical history significant of HTN, HLD, Diabetes Mellitus, Hypothyroidism, Dementia, and other comorbids who presented from home with Confusion and Unwitnessed Fall and unknown time of laying on the floor. Found to be in rhabdomyolysis   Assessment/Plan  Acute Metabolic Encephalopathy on Concomitant Dementia, improving  -Head CT Showed No acute intracranial pathology. No evidence of cervical spine injury. -UDS Negative -Urinalysis showed Negative Leukocytes, Negative Nitrites and 0-5 WBC; Urine Cx Pending -Patient still confused but able to follow commands and answer questions; ?whether worsening Dementia -Swallow Screen by SLP prior to being given a diet. -Hold Donepezil CT head wo contrast shows no acute pathology TSH with NL EEG negative patient has had a slight cough for last 3 days, CXR negative  MRI cancelled , doubt CVA  Improved MS , recognizes care giver    Cough DG chest x-ray negative Patient is 100% on room air   Syncope with Unwitnessed Fall -Head CT Negative -Patient does not know how she ended up on the floor and unknown how long she was laying on floor; There is no evidence of pelvic fracture or diastasis. No pelvic bone lesions are seen on Pelvis X-Ray and Elbow X-Ray showed Radiocapitellar degenerative changes. No acute bony abnormalities. -ECHOcardiogram LV EF: 55% -   60% -Telemetry and Neuro Checks stable  -Hold Sedative Medications and Donepezil  -Troponin I x 3 -PT/OT to Evaluate and Treat- recommend SNF vitamin B12  229, borderline low, started on supplementation   Mild Rhabdomyolysis from laying on floor from Fall -CK was [id number] >3150  and CK MB was 27.1; Troponin I poc was  0.03 -Urinalysis showed 30 RBC/HPF and Large Hb on Urine Dipstick -continue IV Hydration    Hypothyroidism   TSH and Free T4 is NL -C/w Levothyroxine 25 mcg po Daily  Diabetes Mellitus Type 2  -Patient's BS was 122 on Admission  HbA1c 6.0 -Continue to Monitor and if necessary place on SSI  Hypertension -Hold Losartan because of Rhabdo -Continue to Monitor BP's -Add Hydralazine As needed   DVT prophylaxis: Heparin sq Code Status: DO NOT RESUSCITATE    Family Communication: Discussed in detail with the patient/caregiver/son , all imaging results, lab results explained to the patient   Disposition Plan:  Dc 1-2 days      Consultants:  None   Procedures:  None   Antibiotics: Anti-infectives    None         HPI/Subjective: Patient is confused, caregivers by the bedside states that patient has had a cough since last Friday   Objective: Vitals:   06/20/16 0004 06/20/16 0500 06/20/16 0658 06/20/16 0702  BP: (!) 157/52  (!) 140/53   Pulse: 91  84   Resp: 20  20   Temp: 98.6 F (37 C)  97.5 F (36.4 C)   TempSrc: Oral  Oral   SpO2: 99%  93%   Weight:  64.9 kg (143 lb 1.3 oz)  62.4 kg (137 lb 9.1 oz)  Height:        Intake/Output Summary (Last 24 hours) at 06/20/16 1233 Last data filed at 06/20/16 0849  Gross per 24 hour  Intake  1120 ml  Output             1300 ml  Net             -180 ml    Exam:  Examination:  General exam: Appears calm and comfortable  Respiratory system: Clear to auscultation. Respiratory effort normal. Cardiovascular system: S1 & S2 heard, RRR. No JVD, murmurs, rubs, gallops or clicks. No pedal edema. Gastrointestinal system: Abdomen is nondistended, soft and nontender. No organomegaly or masses felt. Normal bowel sounds heard. Central nervous system: Alert and oriented. No focal neurological deficits. Extremities: Symmetric 5 x 5 power. Skin: No rashes, lesions or ulcers Psychiatry: Judgement and insight  appear normal. Mood & affect appropriate.     Data Reviewed: I have personally reviewed following labs and imaging studies  Micro Results Recent Results (from the past 240 hour(s))  Urine culture     Status: None   Collection Time: 06/18/16  3:03 PM  Result Value Ref Range Status   Specimen Description URINE, RANDOM  Final   Special Requests NONE  Final   Culture NO GROWTH Performed at [facility]   Final   Report Status 06/19/2016 FINAL  Final    Radiology Reports Dg Chest 2 View  Result Date: 06/19/2016 CLINICAL DATA:  Cough EXAM: CHEST  2 VIEW COMPARISON:  01/21/2015 FINDINGS: Cardiomediastinal silhouette is stable. Elevation of the left hemidiaphragm again noted. No infiltrate or pulmonary edema. Osteopenia and degenerative changes thoracic spine. IMPRESSION: No infiltrate or pulmonary edema. Chronic elevation of the left hemidiaphragm. Degenerative changes thoracic spine. Electronically Signed   By: Lahoma Crocker M.D.   On: 06/19/2016 11:18   Dg Pelvis 1-2 Views  Result Date: 06/18/2016 CLINICAL DATA:  Status post fall.  Right-sided pain. EXAM: PELVIS - 1-2 VIEW COMPARISON:  None. FINDINGS: There is no evidence of pelvic fracture or diastasis. No pelvic bone lesions are seen. IMPRESSION: Negative. Electronically Signed   By: Fidela Salisbury M.D.   On: 06/18/2016 13:34   Dg Elbow Complete Right  Result Date: 06/18/2016 CLINICAL DATA:  RIGHT elbow pain post fall this morning EXAM: RIGHT ELBOW - COMPLETE 3+ VIEW COMPARISON:  None FINDINGS: Osseous demineralization. Radiocapitellar joint space narrowing and mild capitellar irregularity compatible with degenerative changes. Small olecranon spur. No acute fracture, dislocation, or bone destruction. No elbow joint effusion. IMPRESSION: Radiocapitellar degenerative changes. No acute bony abnormalities. Electronically Signed   By: Lavonia Dana M.D.   On: 06/18/2016 12:09   Ct Head Wo Contrast  Result Date:  06/18/2016 CLINICAL DATA:  Fall this morning EXAM: CT HEAD WITHOUT CONTRAST CT CERVICAL SPINE WITHOUT CONTRAST TECHNIQUE: Multidetector CT imaging of the head and cervical spine was performed following the standard protocol without intravenous contrast. Multiplanar CT image reconstructions of the cervical spine were also generated. COMPARISON:  10/16/2005 FINDINGS: CT HEAD FINDINGS Brain: Mild chronic ischemic changes in the periventricular white matter. Small calcified extra-axial mass over the right parietal lobe is stable. There is no mass effect, midline shift, or acute hemorrhage. Vascular: No hyperdense vessel or unexpected calcification. Skull: Cranium is intact. Sinuses/Orbits: There is an air-fluid level in the left maxillary sinus. There is mucosal thickening in the ethmoid air cells. Mastoid air cells are clear. Sphenoid sinuses clear. Other: Noncontributory CT CERVICAL SPINE FINDINGS Alignment: Anatomic alignment. Skull base and vertebrae: No acute fracture. No primary bone lesion or focal pathologic process. Soft tissues and spinal canal: No prevertebral fluid or swelling. No visible canal hematoma. Disc levels: In  no evidence of spinal stenosis. Left-sided facet arthropathy occurs throughout the upper and mid cervical spine. Upper chest: Negative. Other: Noncontributory IMPRESSION: No acute intracranial pathology. No evidence of cervical spine injury. Electronically Signed   By: Marybelle Killings M.D.   On: 06/18/2016 14:13   Ct Cervical Spine Wo Contrast  Result Date: 06/18/2016 CLINICAL DATA:  Fall this morning EXAM: CT HEAD WITHOUT CONTRAST CT CERVICAL SPINE WITHOUT CONTRAST TECHNIQUE: Multidetector CT imaging of the head and cervical spine was performed following the standard protocol without intravenous contrast. Multiplanar CT image reconstructions of the cervical spine were also generated. COMPARISON:  10/16/2005 FINDINGS: CT HEAD FINDINGS Brain: Mild chronic ischemic changes in the  periventricular white matter. Small calcified extra-axial mass over the right parietal lobe is stable. There is no mass effect, midline shift, or acute hemorrhage. Vascular: No hyperdense vessel or unexpected calcification. Skull: Cranium is intact. Sinuses/Orbits: There is an air-fluid level in the left maxillary sinus. There is mucosal thickening in the ethmoid air cells. Mastoid air cells are clear. Sphenoid sinuses clear. Other: Noncontributory CT CERVICAL SPINE FINDINGS Alignment: Anatomic alignment. Skull base and vertebrae: No acute fracture. No primary bone lesion or focal pathologic process. Soft tissues and spinal canal: No prevertebral fluid or swelling. No visible canal hematoma. Disc levels: In no evidence of spinal stenosis. Left-sided facet arthropathy occurs throughout the upper and mid cervical spine. Upper chest: Negative. Other: Noncontributory IMPRESSION: No acute intracranial pathology. No evidence of cervical spine injury. Electronically Signed   By: Marybelle Killings M.D.   On: 06/18/2016 14:13     CBC  Recent Labs Lab 06/18/16 1330 06/18/16 1343 06/18/16 1954 06/19/16 0451  WBC 7.0  --  8.4 6.7  HGB 12.9 12.9 13.0 11.8*  HCT 38.3 38.0 38.6 35.5*  PLT 228  --  199 222  MCV 88.7  --  89.8 89.4  MCH 29.9  --  30.2 29.7  MCHC 33.7  --  33.7 33.2  RDW 13.3  --  13.5 13.5  LYMPHSABS 0.3*  --   --   --   MONOABS 0.8  --   --   --   EOSABS 0.0  --   --   --   BASOSABS 0.0  --   --   --     Chemistries   Recent Labs Lab 06/18/16 1343 06/18/16 1818 06/19/16 0451 06/20/16 0509  NA 140  --  138 141  K 3.5  --  3.4* 3.8  CL 101  --  106 108  CO2  --   --  24 25  GLUCOSE 122*  --  129* 106*  BUN 19  --  21* 13  CREATININE 0.80 0.71 0.64 0.58  CALCIUM  --   --  8.5* 8.4*  AST  --   --  66* 63*  ALT  --   --  29 30  ALKPHOS  --   --  46 40  BILITOT  --   --  0.9 0.5    ------------------------------------------------------------------------------------------------------------------ estimated creatinine clearance is 40.9 mL/min (by C-G formula based on SCr of 0.58 mg/dL). ------------------------------------------------------------------------------------------------------------------  Recent Labs  06/19/16 0451  HGBA1C 6.0*   ------------------------------------------------------------------------------------------------------------------ No results for input(s): CHOL, HDL, LDLCALC, TRIG, CHOLHDL, LDLDIRECT in the last 72 hours. ------------------------------------------------------------------------------------------------------------------  Recent Labs  06/18/16 1818  TSH 0.709   ------------------------------------------------------------------------------------------------------------------  Recent Labs  06/20/16 0509  VITAMINB12 229    Coagulation profile No results for input(s): INR, PROTIME in the last 168  hours.  No results for input(s): DDIMER in the last 72 hours.  Cardiac Enzymes  Recent Labs Lab 06/18/16 1332 06/18/16 1818 06/18/16 2251 06/19/16 0451  CKMB 27.1*  --   --  12.2*  TROPONINI  --  0.04* 0.04* 0.03*   ------------------------------------------------------------------------------------------------------------------ Invalid input(s): POCBNP   CBG:  Recent Labs Lab 06/19/16 0531 06/20/16 0750  GLUCAP 120* 98       Studies: Dg Chest 2 View  Result Date: 06/19/2016 CLINICAL DATA:  Cough EXAM: CHEST  2 VIEW COMPARISON:  01/21/2015 FINDINGS: Cardiomediastinal silhouette is stable. Elevation of the left hemidiaphragm again noted. No infiltrate or pulmonary edema. Osteopenia and degenerative changes thoracic spine. IMPRESSION: No infiltrate or pulmonary edema. Chronic elevation of the left hemidiaphragm. Degenerative changes thoracic spine. Electronically Signed   By: Lahoma Crocker M.D.   On: 06/19/2016  11:18   Dg Pelvis 1-2 Views  Result Date: 06/18/2016 CLINICAL DATA:  Status post fall.  Right-sided pain. EXAM: PELVIS - 1-2 VIEW COMPARISON:  None. FINDINGS: There is no evidence of pelvic fracture or diastasis. No pelvic bone lesions are seen. IMPRESSION: Negative. Electronically Signed   By: Fidela Salisbury M.D.   On: 06/18/2016 13:34   Ct Head Wo Contrast  Result Date: 06/18/2016 CLINICAL DATA:  Fall this morning EXAM: CT HEAD WITHOUT CONTRAST CT CERVICAL SPINE WITHOUT CONTRAST TECHNIQUE: Multidetector CT imaging of the head and cervical spine was performed following the standard protocol without intravenous contrast. Multiplanar CT image reconstructions of the cervical spine were also generated. COMPARISON:  10/16/2005 FINDINGS: CT HEAD FINDINGS Brain: Mild chronic ischemic changes in the periventricular white matter. Small calcified extra-axial mass over the right parietal lobe is stable. There is no mass effect, midline shift, or acute hemorrhage. Vascular: No hyperdense vessel or unexpected calcification. Skull: Cranium is intact. Sinuses/Orbits: There is an air-fluid level in the left maxillary sinus. There is mucosal thickening in the ethmoid air cells. Mastoid air cells are clear. Sphenoid sinuses clear. Other: Noncontributory CT CERVICAL SPINE FINDINGS Alignment: Anatomic alignment. Skull base and vertebrae: No acute fracture. No primary bone lesion or focal pathologic process. Soft tissues and spinal canal: No prevertebral fluid or swelling. No visible canal hematoma. Disc levels: In no evidence of spinal stenosis. Left-sided facet arthropathy occurs throughout the upper and mid cervical spine. Upper chest: Negative. Other: Noncontributory IMPRESSION: No acute intracranial pathology. No evidence of cervical spine injury. Electronically Signed   By: Marybelle Killings M.D.   On: 06/18/2016 14:13   Ct Cervical Spine Wo Contrast  Result Date: 06/18/2016 CLINICAL DATA:  Fall this morning EXAM:  CT HEAD WITHOUT CONTRAST CT CERVICAL SPINE WITHOUT CONTRAST TECHNIQUE: Multidetector CT imaging of the head and cervical spine was performed following the standard protocol without intravenous contrast. Multiplanar CT image reconstructions of the cervical spine were also generated. COMPARISON:  10/16/2005 FINDINGS: CT HEAD FINDINGS Brain: Mild chronic ischemic changes in the periventricular white matter. Small calcified extra-axial mass over the right parietal lobe is stable. There is no mass effect, midline shift, or acute hemorrhage. Vascular: No hyperdense vessel or unexpected calcification. Skull: Cranium is intact. Sinuses/Orbits: There is an air-fluid level in the left maxillary sinus. There is mucosal thickening in the ethmoid air cells. Mastoid air cells are clear. Sphenoid sinuses clear. Other: Noncontributory CT CERVICAL SPINE FINDINGS Alignment: Anatomic alignment. Skull base and vertebrae: No acute fracture. No primary bone lesion or focal pathologic process. Soft tissues and spinal canal: No prevertebral fluid or swelling. No visible canal hematoma. Disc  levels: In no evidence of spinal stenosis. Left-sided facet arthropathy occurs throughout the upper and mid cervical spine. Upper chest: Negative. Other: Noncontributory IMPRESSION: No acute intracranial pathology. No evidence of cervical spine injury. Electronically Signed   By: Marybelle Killings M.D.   On: 06/18/2016 14:13      Lab Results  Component Value Date   HGBA1C 6.0 (H) 06/19/2016   Lab Results  Component Value Date   CREATININE 0.58 06/20/2016       Scheduled Meds: . cyanocobalamin  1,000 mcg Intramuscular Daily  . haloperidol lactate  0.5 mg Intravenous Once  . heparin  5,000 Units Subcutaneous Q8H  . levothyroxine  25 mcg Oral QAC breakfast  . sodium chloride flush  3 mL Intravenous Q12H  . [START ON 06/22/2016] Vitamin D (Ergocalciferol)  50,000 Units Oral Q7 days   Continuous Infusions: . 0.9 % NaCl with KCl 20 mEq / L  125 mL/hr at 06/20/16 1140     LOS: 1 day    Time spent: >30 MINS    [facility] Pager [phone]. If 7PM-7AM, please contact night-coverage at [email or web address], password [facility] 06/20/2016, 12:33 PM  LOS: 1 day

## 2016-06-20 NOTE — Clinical Social Work Placement (Signed)
Patient has a bed at [facility]. CSW has completed FL2 & will continue to follow and assist with discharge when ready.    Raynaldo Opitz, [facility] Clinical Social Worker cell #: [phone]     CLINICAL SOCIAL WORK PLACEMENT  NOTE  Date:  06/20/2016  Patient Details  Name: Emily Heath MRN: [id number] Date of Birth: [birth year]  Clinical Social Work is seeking post-discharge placement for this patient at the [place] level of care (*CSW will initial, date and re-position this form in  chart as items are completed):  Yes   Patient/family provided with [place] Work Department's list of facilities offering this level of care within the geographic area requested by the patient (or if unable, by the patient's family).  Yes   Patient/family informed of their freedom to choose among providers that offer the needed level of care, that participate in Medicare, Medicaid or managed care program needed by the patient, have an available bed and are willing to accept the patient.  Yes   Patient/family informed of 's ownership interest in [facility] and [facility], as well as of the fact that they are under no obligation to receive care at these facilities.  PASRR submitted to EDS on 06/20/16     PASRR number received on 06/20/16     Existing PASRR number confirmed on       FL2 transmitted to all facilities in geographic area requested by pt/family on 06/20/16     FL2 transmitted to all facilities within larger geographic area on       Patient informed that his/her managed care company has contracts with or will negotiate with certain facilities, including the following:        Yes   Patient/family informed of bed offers received.  Patient [facility] bed [facility]     Physician recommends and patient chooses bed at      Patient to be transferred to [facility] on   .  Patient to be transferred to facility by       Patient family notified on   of transfer.  Name of family member notified:        PHYSICIAN       Additional Comment:    _______________________________________________ Standley Brooking, LCSW 06/20/2016, 4:25 PM

## 2016-06-20 NOTE — Clinical Social Work Placement (Signed)
   CLINICAL SOCIAL WORK PLACEMENT  NOTE  Date:  06/20/2016  Patient Details  Name: Emily Heath MRN: [id number] Date of Birth: 06/20/1930  Clinical Social Work is seeking post-discharge placement for this patient at the [place] level of care (*CSW will initial, date and re-position this form in  chart as items are completed):  Yes   Patient/family provided with [place] Work Department's list of facilities offering this level of care within the geographic area requested by the patient (or if unable, by the patient's family).  Yes   Patient/family informed of their freedom to choose among providers that offer the needed level of care, that participate in Medicare, Medicaid or managed care program needed by the patient, have an available bed and are willing to accept the patient.  Yes   Patient/family informed of [place]'s ownership interest in [facility] and [facility], as well as of the fact that they are under no obligation to receive care at these facilities.  PASRR submitted to EDS on 06/20/16     PASRR number received on 06/20/16     Existing PASRR number confirmed on       FL2 transmitted to all facilities in geographic area requested by pt/family on 06/20/16     FL2 transmitted to all facilities within larger geographic area on       Patient informed that his/her managed care company has contracts with or will negotiate with certain facilities, including the following:            Patient/family informed of bed offers received.  Patient chooses bed at       Physician recommends and patient chooses bed at      Patient to be transferred to   on  .  Patient to be transferred to facility by       Patient family notified on   of transfer.  Name of family member notified:        PHYSICIAN       Additional Comment:    _______________________________________________ Standley Brooking, LCSW 06/20/2016, 3:41 PM

## 2016-06-20 NOTE — Clinical Social Work Note (Signed)
Clinical Social Work Assessment  Patient Details  Name: Emily Heath MRN: [id number] Date of Birth: 10-Jul-1929  Date of referral:  06/20/16               Reason for consult:  Facility Placement                Permission sought to share information with:  Chartered certified accountant granted to share information::  Yes, Verbal Permission Granted  Name::        Agency::     Relationship::     Contact Information:     Housing/Transportation Living arrangements for the past 2 months:  Single Family Home Source of Information:  Adult Children Patient Interpreter Needed:  None Criminal Activity/Legal Involvement Pertinent to Current Situation/Hospitalization:  No - Comment as needed Significant Relationships:  Adult Children Lives with:  Self Do you feel safe going back to the place where you live?  No Need for family participation in patient care:  Yes (Comment)  Care giving concerns:  CSW received consult for SNF placement.    Social Worker assessment / plan:  CSW spoke with patient's son, [name] re: discharge planning. Patient's son informed CSW that he has been talking with Colletta Maryland at [facility] about getting his mom in there. CSW reviewed PT evaluation recommending SNF at discharge. Son expressed interest in [facility] confirmed with Janie at [facility] that they would have a bed available.   Employment status:  Retired Forensic scientist:  Medicare PT Recommendations:  [place] / Referral to community resources:  [place]  Patient/Family's Response to care:  Patient's son was very Patent attorney of CSW visit and direction.   Patient/Family's Understanding of and Emotional Response to Diagnosis, Current Treatment, and Prognosis:    Emotional Assessment Appearance:  Appears stated age Attitude/Demeanor/Rapport:    Affect (typically observed):    Orientation:  Oriented to Self, Oriented to Place,  Oriented to  Time, Oriented to Situation Alcohol / Substance use:    Psych involvement (Current and /or in the community):     Discharge Needs  Concerns to be addressed:    Readmission within the last 30 days:    Current discharge risk:    Barriers to Discharge:      Standley Brooking, LCSW 06/20/2016, 3:40 PM

## 2016-06-21 DIAGNOSIS — S5001XA Contusion of right elbow, initial encounter: Secondary | ICD-10-CM

## 2016-06-21 DIAGNOSIS — T148XXA Other injury of unspecified body region, initial encounter: Secondary | ICD-10-CM

## 2016-06-21 LAB — BASIC METABOLIC PANEL
Anion gap: 8 (ref 5–15)
BUN: 13 mg/dL (ref 6–20)
CALCIUM: 8.6 mg/dL — AB (ref 8.9–10.3)
CO2: 25 mmol/L (ref 22–32)
CREATININE: 0.56 mg/dL (ref 0.44–1.00)
Chloride: 109 mmol/L (ref 101–111)
GFR calc Af Amer: >60 mL/min (ref >60)
GLUCOSE: 101 mg/dL — AB (ref 65–99)
Potassium: 3.8 mmol/L (ref 3.5–5.1)
SODIUM: 142 mmol/L (ref 135–145)

## 2016-06-21 LAB — CK: Total CK: 2012 U/L — ABNORMAL HIGH (ref 38–234)

## 2016-06-21 LAB — GLUCOSE, CAPILLARY: Glucose-Capillary: 87 mg/dL (ref 65–99)

## 2016-06-21 NOTE — Progress Notes (Signed)
Physical Therapy Treatment Patient Details Name: Emily Heath MRN: [id number] DOB: 24-Jun-1930 Today's Date: 06/27/16    History of Present Illness 82 y.o. female with medical history significant of HTN, HLD, Diabetes Mellitus, Hypothyroidism, Dementia who presented from home with Confusion and Unwitnessed Fall and unknown time of laying on the floor. Dx of mild rhabdomyosysis, acute encephalopathy    PT Comments    Pt up in recliner on arrival and able to ambulate short distance in hallway today.  Pt requiring min to mod assist for mobility at this time, so continue to recommend SNF upon d/c.  Follow Up Recommendations  Supervision/Assistance - 24 hour;SNF     Equipment Recommendations  Rolling walker with 5" wheels    Recommendations for Other Services       Precautions / Restrictions Precautions Precautions: Fall    Mobility  Bed Mobility               General bed mobility comments: up in chair   Transfers Overall transfer level: Needs assistance Equipment used: Rolling walker (2 wheeled) Transfers: Sit to/from Stand Sit to Stand: Mod assist         General transfer comment: assist to rise and for steadying, verbal and visual cues for hand placement  Ambulation/Gait Ambulation/Gait assistance: Min assist Ambulation Distance (Feet): 40 Feet Assistive device: Rolling walker (2 wheeled) Gait Pattern/deviations: Step-through pattern;Decreased stride length;Trunk flexed     General Gait Details: multimodal cues for use of RW, assist for negotiating RW, pt fatigues quickly   Stairs            Wheelchair Mobility    Modified Rankin (Stroke Patients Only)       Balance                                    Cognition Arousal/Alertness: Awake/alert Behavior During Therapy: WFL for tasks assessed/performed;Flat affect Overall Cognitive Status: History of cognitive impairments - at baseline                 General Comments:  h/o dementia    Exercises      General Comments        Pertinent Vitals/Pain Pain Assessment: No/denies pain    Home Living                      Prior Function            PT Goals (current goals can now be found in the care plan section) Progress towards PT goals: Progressing toward goals    Frequency    Min 3X/week      PT Plan Current plan remains appropriate    Co-evaluation             End of Session Equipment Utilized During Treatment: Gait belt Activity Tolerance: Patient tolerated treatment well Patient left: in chair;with call bell/phone within reach;with chair alarm set;with family/visitor present     Time: [id number] PT Time Calculation (min) (ACUTE ONLY): 14 min  Charges:  $Gait Training: 8-22 mins                    G Codes:      Laszlo Ellerby,KATHrine E 2016-06-27, 12:44 PM Carmelia Bake, PT, DPT 2016/06/27 Pager: [phone]

## 2016-06-21 NOTE — Progress Notes (Signed)
[facility] PROGRESS NOTE  Emily Heath [id number] DOB: Apr 17, 1930 DOA: 06/18/2016   PCP: Idamae Schuller, MD     Assessment/Plan: Active Problems:   Altered mental status   Syncope and collapse   Cough   Fall   Abrasion   Contusion of right elbow   82 y.o. female with medical history significant of HTN, HLD, Diabetes Mellitus, Hypothyroidism, Dementia, and other comorbids who presented from home with Confusion and Unwitnessed Fall and unknown time of laying on the floor. Found to be in rhabdomyolysis   Assessment/Plan  Acute Metabolic Encephalopathy on Concomitant Dementia, improving  -Head CT Showed No acute intracranial pathology. No evidence of cervical spine injury. -UDS Negative -Urinalysis showed Negative Leukocytes, Negative Nitrites and 0-5 WBC; Urine Cx Pending -Patient still confused but able to follow commands and answer questions; ?whether worsening Dementia -Swallow Screen by SLP prior to being given a diet. -Hold Donepezil CT head wo contrast shows no acute pathology TSH with NL EEG negative patient has had a slight cough for last 3 days, CXR negative  MRI cancelled , doubt CVA , 100% back to baseline level of alertness  Cough DG chest x-ray negative Patient is 100% on room air   Syncope with Unwitnessed Fall -Head CT Negative -Patient does not know how she ended up on the floor and unknown how long she was laying on floor; There is no evidence of pelvic fracture or diastasis. No pelvic bone lesions are seen on Pelvis X-Ray and Elbow X-Ray showed Radiocapitellar degenerative changes. No acute bony abnormalities. -ECHOcardiogram LV EF: 55% -   60% -Telemetry and Neuro Checks stable  -Hold Sedative Medications and Donepezil  -Troponin I x 3 -PT/OT to Evaluate and Treat- recommend SNF vitamin B12  229, borderline low, started on supplementation   Mild Rhabdomyolysis from laying on floor from Fall -CK was [id number] >3150  >2012 and  CK MB was 27.1; Troponin I poc was 0.03 -Urinalysis showed 30 RBC/HPF and Large Hb on Urine Dipstick DC fluids, patient has developed crackles  Hypothyroidism   TSH and Free T4 is NL -C/w Levothyroxine 25 mcg po Daily  Diabetes Mellitus Type 2  -Patient's BS was 122 on Admission  HbA1c 6.0 -Continue to Monitor and if necessary place on SSI  Hypertension -Hold Losartan because of Rhabdo -Continue to Monitor BP's -Add Hydralazine As needed   DVT prophylaxis: Heparin sq Code Status: DO NOT RESUSCITATE    Family Communication: Discussed in detail with the patient/caregiver/son , all imaging results, lab results explained to the patient   Disposition Plan: Anticipate discharge to SNF tomorrow     Consultants:  None   Procedures:  None   Antibiotics: Anti-infectives    None         HPI/Subjective: Currently alert and oriented, recognizes her caregiver, denies any chest pain or shortness of breath   Objective: Vitals:   06/20/16 0702 06/20/16 1400 06/20/16 2104 06/21/16 0553  BP:  (!) 125/46 136/70 127/73  Pulse:  80 78 75  Resp:  15 20 20   Temp:  97.7 F (36.5 C) 98.2 F (36.8 C) 98.9 F (37.2 C)  TempSrc:  Oral Oral Oral  SpO2:   94% 95%  Weight: 62.4 kg (137 lb 9.1 oz)   61.1 kg (134 lb 11.2 oz)  Height:        Intake/Output Summary (Last 24 hours) at 06/21/16 1220 Last data filed at 06/21/16 0700  Gross per 24 hour  Intake  4270.42 ml  Output              400 ml  Net          3870.42 ml    Exam:  Examination:  General exam: Appears calm and comfortable  Respiratory system: Clear to auscultation. Respiratory effort normal. Cardiovascular system: S1 & S2 heard, RRR. No JVD, murmurs, rubs, gallops or clicks. No pedal edema. Gastrointestinal system: Abdomen is nondistended, soft and nontender. No organomegaly or masses felt. Normal bowel sounds heard. Central nervous system: Alert and oriented. No focal neurological  deficits. Extremities: Symmetric 5 x 5 power. Skin: No rashes, lesions or ulcers Psychiatry: Judgement and insight appear normal. Mood & affect appropriate.     Data Reviewed: I have personally reviewed following labs and imaging studies  Micro Results Recent Results (from the past 240 hour(s))  Urine culture     Status: None   Collection Time: 06/18/16  3:03 PM  Result Value Ref Range Status   Specimen Description URINE, RANDOM  Final   Special Requests NONE  Final   Culture NO GROWTH Performed at [facility]   Final   Report Status 06/19/2016 FINAL  Final  C difficile quick scan w PCR reflex     Status: None   Collection Time: 06/20/16  5:11 PM  Result Value Ref Range Status   C Diff antigen NEGATIVE NEGATIVE Final   C Diff toxin NEGATIVE NEGATIVE Final   C Diff interpretation No C. difficile detected.  Final    Radiology Reports Dg Chest 2 View  Result Date: 06/19/2016 CLINICAL DATA:  Cough EXAM: CHEST  2 VIEW COMPARISON:  01/21/2015 FINDINGS: Cardiomediastinal silhouette is stable. Elevation of the left hemidiaphragm again noted. No infiltrate or pulmonary edema. Osteopenia and degenerative changes thoracic spine. IMPRESSION: No infiltrate or pulmonary edema. Chronic elevation of the left hemidiaphragm. Degenerative changes thoracic spine. Electronically Signed   By: Lahoma Crocker M.D.   On: 06/19/2016 11:18   Dg Pelvis 1-2 Views  Result Date: 06/18/2016 CLINICAL DATA:  Status post fall.  Right-sided pain. EXAM: PELVIS - 1-2 VIEW COMPARISON:  None. FINDINGS: There is no evidence of pelvic fracture or diastasis. No pelvic bone lesions are seen. IMPRESSION: Negative. Electronically Signed   By: Fidela Salisbury M.D.   On: 06/18/2016 13:34   Dg Elbow Complete Right  Result Date: 06/18/2016 CLINICAL DATA:  RIGHT elbow pain post fall this morning EXAM: RIGHT ELBOW - COMPLETE 3+ VIEW COMPARISON:  None FINDINGS: Osseous demineralization. Radiocapitellar joint space  narrowing and mild capitellar irregularity compatible with degenerative changes. Small olecranon spur. No acute fracture, dislocation, or bone destruction. No elbow joint effusion. IMPRESSION: Radiocapitellar degenerative changes. No acute bony abnormalities. Electronically Signed   By: Lavonia Dana M.D.   On: 06/18/2016 12:09   Ct Head Wo Contrast  Result Date: 06/18/2016 CLINICAL DATA:  Fall this morning EXAM: CT HEAD WITHOUT CONTRAST CT CERVICAL SPINE WITHOUT CONTRAST TECHNIQUE: Multidetector CT imaging of the head and cervical spine was performed following the standard protocol without intravenous contrast. Multiplanar CT image reconstructions of the cervical spine were also generated. COMPARISON:  10/16/2005 FINDINGS: CT HEAD FINDINGS Brain: Mild chronic ischemic changes in the periventricular white matter. Small calcified extra-axial mass over the right parietal lobe is stable. There is no mass effect, midline shift, or acute hemorrhage. Vascular: No hyperdense vessel or unexpected calcification. Skull: Cranium is intact. Sinuses/Orbits: There is an air-fluid level in the left maxillary sinus. There is mucosal thickening in  the ethmoid air cells. Mastoid air cells are clear. Sphenoid sinuses clear. Other: Noncontributory CT CERVICAL SPINE FINDINGS Alignment: Anatomic alignment. Skull base and vertebrae: No acute fracture. No primary bone lesion or focal pathologic process. Soft tissues and spinal canal: No prevertebral fluid or swelling. No visible canal hematoma. Disc levels: In no evidence of spinal stenosis. Left-sided facet arthropathy occurs throughout the upper and mid cervical spine. Upper chest: Negative. Other: Noncontributory IMPRESSION: No acute intracranial pathology. No evidence of cervical spine injury. Electronically Signed   By: Marybelle Killings M.D.   On: 06/18/2016 14:13   Ct Cervical Spine Wo Contrast  Result Date: 06/18/2016 CLINICAL DATA:  Fall this morning EXAM: CT HEAD WITHOUT  CONTRAST CT CERVICAL SPINE WITHOUT CONTRAST TECHNIQUE: Multidetector CT imaging of the head and cervical spine was performed following the standard protocol without intravenous contrast. Multiplanar CT image reconstructions of the cervical spine were also generated. COMPARISON:  10/16/2005 FINDINGS: CT HEAD FINDINGS Brain: Mild chronic ischemic changes in the periventricular white matter. Small calcified extra-axial mass over the right parietal lobe is stable. There is no mass effect, midline shift, or acute hemorrhage. Vascular: No hyperdense vessel or unexpected calcification. Skull: Cranium is intact. Sinuses/Orbits: There is an air-fluid level in the left maxillary sinus. There is mucosal thickening in the ethmoid air cells. Mastoid air cells are clear. Sphenoid sinuses clear. Other: Noncontributory CT CERVICAL SPINE FINDINGS Alignment: Anatomic alignment. Skull base and vertebrae: No acute fracture. No primary bone lesion or focal pathologic process. Soft tissues and spinal canal: No prevertebral fluid or swelling. No visible canal hematoma. Disc levels: In no evidence of spinal stenosis. Left-sided facet arthropathy occurs throughout the upper and mid cervical spine. Upper chest: Negative. Other: Noncontributory IMPRESSION: No acute intracranial pathology. No evidence of cervical spine injury. Electronically Signed   By: Marybelle Killings M.D.   On: 06/18/2016 14:13     CBC  Recent Labs Lab 06/18/16 1330 06/18/16 1343 06/18/16 1954 06/19/16 0451  WBC 7.0  --  8.4 6.7  HGB 12.9 12.9 13.0 11.8*  HCT 38.3 38.0 38.6 35.5*  PLT 228  --  199 222  MCV 88.7  --  89.8 89.4  MCH 29.9  --  30.2 29.7  MCHC 33.7  --  33.7 33.2  RDW 13.3  --  13.5 13.5  LYMPHSABS 0.3*  --   --   --   MONOABS 0.8  --   --   --   EOSABS 0.0  --   --   --   BASOSABS 0.0  --   --   --     Chemistries   Recent Labs Lab 06/18/16 1343 06/18/16 1818 06/19/16 0451 06/20/16 0509 06/21/16 0451  NA 140  --  138 141 142  K  3.5  --  3.4* 3.8 3.8  CL 101  --  106 108 109  CO2  --   --  24 25 25   GLUCOSE 122*  --  129* 106* 101*  BUN 19  --  21* 13 13  CREATININE 0.80 0.71 0.64 0.58 0.56  CALCIUM  --   --  8.5* 8.4* 8.6*  AST  --   --  66* 63*  --   ALT  --   --  29 30  --   ALKPHOS  --   --  46 40  --   BILITOT  --   --  0.9 0.5  --    ------------------------------------------------------------------------------------------------------------------ estimated creatinine clearance is 40.5  mL/min (by C-G formula based on SCr of 0.56 mg/dL). ------------------------------------------------------------------------------------------------------------------  Recent Labs  06/19/16 0451  HGBA1C 6.0*   ------------------------------------------------------------------------------------------------------------------ No results for input(s): CHOL, HDL, LDLCALC, TRIG, CHOLHDL, LDLDIRECT in the last 72 hours. ------------------------------------------------------------------------------------------------------------------  Recent Labs  06/18/16 1818  TSH 0.709   ------------------------------------------------------------------------------------------------------------------  Recent Labs  06/20/16 0509  VITAMINB12 229    Coagulation profile No results for input(s): INR, PROTIME in the last 168 hours.  No results for input(s): DDIMER in the last 72 hours.  Cardiac Enzymes  Recent Labs Lab 06/18/16 1332 06/18/16 1818 06/18/16 2251 06/19/16 0451  CKMB 27.1*  --   --  12.2*  TROPONINI  --  0.04* 0.04* 0.03*   ------------------------------------------------------------------------------------------------------------------ Invalid input(s): POCBNP   CBG:  Recent Labs Lab 06/19/16 0531 06/20/16 0750 06/21/16 0734  GLUCAP 120* 98 87       Studies: No results found.    Lab Results  Component Value Date   HGBA1C 6.0 (H) 06/19/2016   Lab Results  Component Value Date   CREATININE  0.56 06/21/2016       Scheduled Meds: . haloperidol lactate  0.5 mg Intravenous Once  . heparin  5,000 Units Subcutaneous Q8H  . levothyroxine  25 mcg Oral QAC breakfast  . sodium chloride flush  3 mL Intravenous Q12H  . [START ON 06/22/2016] Vitamin D (Ergocalciferol)  50,000 Units Oral Q7 days   Continuous Infusions: . 0.9 % NaCl with KCl 20 mEq / L 75 mL/hr at 06/21/16 0330     LOS: 2 days    Time spent: >30 MINS    [facility]  [facility] Pager [phone]. If 7PM-7AM, please contact night-coverage at [email or web address], password [email or web address] 06/21/2016, 12:20 PM  LOS: 2 days

## 2016-06-21 NOTE — Progress Notes (Signed)
Physical Therapy Treatment Patient Details Name: Emily Heath MRN: [id number] DOB: Nov 16, 1929 Today's Date: 07-18-16    History of Present Illness 82 y.o. female with medical history significant of HTN, HLD, Diabetes Mellitus, Hypothyroidism, Dementia who presented from home with Confusion and Unwitnessed Fall and unknown time of laying on the floor. Dx of mild rhabdomyosysis, acute encephalopathy    PT Comments    Pt attempting to get OOB unassisted and caregiver requesting help; pt needed to use bathroom.  Pt assisted safely to/from [facility] and then returned to bed.  Follow Up Recommendations  Supervision/Assistance - 24 hour;SNF     Equipment Recommendations  Rolling walker with 5" wheels    Recommendations for Other Services       Precautions / Restrictions Precautions Precautions: Fall    Mobility  Bed Mobility Overal bed mobility: Needs Assistance Bed Mobility: Sit to Supine       Sit to supine: Min assist   General bed mobility comments: pt attempting to get OOB on arrival, caregiver requested assistance, assist to lift LEs onto bed   Transfers Overall transfer level: Needs assistance Equipment used: 1 person hand held assist Transfers: Sit to/from Stand;Stand Pivot Transfers Sit to Stand: Min guard Stand pivot transfers: Min assist       General transfer comment: brought BSC close to bed due to urgency, assist to steady with returning to bed, visual cues for hand placement to self assist  Ambulation/Gait                 Stairs            Wheelchair Mobility    Modified Rankin (Stroke Patients Only)       Balance Overall balance assessment: Needs assistance Sitting-balance support: Feet supported Sitting balance-Leahy Scale: Fair     Standing balance support: No upper extremity supported Standing balance-Leahy Scale: Fair                      Cognition Arousal/Alertness: Awake/alert Behavior During Therapy: WFL for  tasks assessed/performed;Flat affect Overall Cognitive Status: History of cognitive impairments - at baseline                 General Comments: h/o dementia    Exercises      General Comments        Pertinent Vitals/Pain Pain Assessment: No/denies pain    Home Living                      Prior Function            PT Goals (current goals can now be found in the care plan section) Progress towards PT goals: Progressing toward goals    Frequency    Min 3X/week      PT Plan Current plan remains appropriate    Co-evaluation             End of Session   Activity Tolerance: Patient tolerated treatment well Patient left: with call bell/phone within reach;with chair alarm set;with family/visitor present;in bed     Time: [id number] PT Time Calculation (min) (ACUTE ONLY): 10 min  Charges:  $Therapeutic Activity: 8-22 mins                    G Codes:      Bebe Moncure,KATHrine E 18-Jul-2016, 5:00 PM Carmelia Bake, PT, DPT 07-18-16 Pager: [phone]

## 2016-06-21 NOTE — Progress Notes (Signed)
Occupational Therapy Treatment Note  Pt requires min guard assist for toilet transfers ambulating into bathroom and for grooming at sink - demonstrates improving endurance and balance.    06/21/16 1629  OT Visit Information  Last OT Received On 06/21/16  Assistance Needed +1  History of Present Illness 82 y.o. female with medical history significant of HTN, HLD, Diabetes Mellitus, Hypothyroidism, Dementia who presented from home with Confusion and Unwitnessed Fall and unknown time of laying on the floor. Dx of mild rhabdomyosysis, acute encephalopathy  Precautions  Precautions Fall  Pain Assessment  Pain Assessment No/denies pain  Cognition  Arousal/Alertness Awake/alert  Behavior During Therapy WFL for tasks assessed/performed;Flat affect  Overall Cognitive Status History of cognitive impairments - at baseline  ADL  Overall ADL's  Needs assistance/impaired  Grooming Wash/dry hands;Min Armed forces technical officer Min guard;Ambulation;Comfort height toilet;RW  Actuary Min guard;Sit to/from stand  Functional mobility during ADLs Min guard;Minimal assistance  Bed Mobility  Overal bed mobility Needs Assistance  Bed Mobility Sit to Supine  Sit to supine Min assist  General bed mobility comments assist to lift LEs onto bed and to scoot up in bed   Balance  Overall balance assessment Needs assistance  Sitting-balance support Feet supported  Sitting balance-Leahy Scale Fair  Standing balance support No upper extremity supported  Standing balance-Leahy Scale Fair  Transfers  Overall transfer level Needs assistance  Equipment used 1 person hand held assist  Transfers Sit to/from Stand;Stand Pivot Transfers  Sit to Stand Min guard  Stand pivot transfers Min guard  OT - End of Session  Activity Tolerance Patient tolerated treatment well  Patient left in bed;with call bell/phone within reach;with bed alarm set  Nurse Communication Mobility  status  OT Assessment/Plan  OT Plan Discharge plan remains appropriate  OT Frequency (ACUTE ONLY) Min 2X/week  Follow Up Recommendations SNF  OT Equipment None recommended by OT  OT Goal Progression  Progress towards OT goals Progressing toward goals  OT Time Calculation  OT Start Time (ACUTE ONLY) 1613  OT Stop Time (ACUTE ONLY) 1629  OT Time Calculation (min) 16 min  [facility], OTR/L [phone]

## 2016-06-21 NOTE — Progress Notes (Signed)
Occupational Therapy Treatment Patient Details Name: Emily Heath MRN: [id number] DOB: 1930-02-07 Today's Date: 06/21/2016    History of present illness 82 y.o. female with medical history significant of HTN, HLD, Diabetes Mellitus, Hypothyroidism, Dementia who presented from home with Confusion and Unwitnessed Fall and unknown time of laying on the floor. Dx of mild rhabdomyosysis, acute encephalopathy   OT comments  Pt with improving balance and improved independence with ADLs - min A for functional mobility   Follow Up Recommendations  SNF    Equipment Recommendations  None recommended by OT    Recommendations for Other Services      Precautions / Restrictions Precautions Precautions: Fall       Mobility Bed Mobility Overal bed mobility: Needs Assistance Bed Mobility: Sit to Supine       Sit to supine: Min assist   General bed mobility comments: assist to lift LEs onto bed and to scoot up in bed   Transfers Overall transfer level: Needs assistance Equipment used: 2 person hand held assist Transfers: Sit to/from Stand;Stand Pivot Transfers Sit to Stand: Min assist;Min guard         General transfer comment: Pt required assist to steady     Balance Overall balance assessment: Needs assistance Sitting-balance support: Feet supported Sitting balance-Leahy Scale: Fair     Standing balance support: No upper extremity supported Standing balance-Leahy Scale: Fair                     ADL Overall ADL's : Needs assistance/impaired                         Toilet Transfer: Minimal assistance;Ambulation;Comfort height toilet;RW   Toileting- Water quality scientist and Hygiene: Min guard;Sit to/from stand       Functional mobility during ADLs: Minimal assistance General ADL Comments: Pt ambulated ~200' on unit       Vision                     Perception     Praxis      Cognition   Behavior During Therapy: [facility] for tasks  assessed/performed;Flat affect Overall Cognitive Status: History of cognitive impairments - at baseline                  General Comments: h/o dementia    Extremity/Trunk Assessment               Exercises     Shoulder Instructions       General Comments      Pertinent Vitals/ Pain       Pain Assessment: No/denies pain  Home Living                                          Prior Functioning/Environment              Frequency  Min 2X/week        Progress Toward Goals  OT Goals(current goals can now be found in the care plan section)  Progress towards OT goals: Progressing toward goals     Plan Discharge plan remains appropriate    Co-evaluation                 End of Session     Activity Tolerance Patient tolerated treatment well   Patient Left in bed;with  call bell/phone within reach;with bed alarm set   Nurse Communication Mobility status        Time: [id number] OT Time Calculation (min): 19 min  Charges: OT General Charges $OT Visit: 1 Procedure OT Treatments $Self Care/Home Management : 8-22 mins  Tiara Bartoli M 06/21/2016, 4:14 PM

## 2016-06-22 LAB — CBC
HCT: 34.9 % — ABNORMAL LOW (ref 36.0–46.0)
Hemoglobin: 11.7 g/dL — ABNORMAL LOW (ref 12.0–15.0)
MCH: 29.6 pg (ref 26.0–34.0)
MCHC: 33.5 g/dL (ref 30.0–36.0)
MCV: 88.4 fL (ref 78.0–100.0)
PLATELETS: 211 10*3/uL (ref 150–400)
RBC: 3.95 MIL/uL (ref 3.87–5.11)
RDW: 13.3 % (ref 11.5–15.5)
WBC: 4.4 10*3/uL (ref 4.0–10.5)

## 2016-06-22 LAB — COMPREHENSIVE METABOLIC PANEL
ALBUMIN: 3.4 g/dL — AB (ref 3.5–5.0)
ALT: 32 U/L (ref 14–54)
ANION GAP: 7 (ref 5–15)
AST: 49 U/L — AB (ref 15–41)
Alkaline Phosphatase: 41 U/L (ref 38–126)
BUN: 14 mg/dL (ref 6–20)
CHLORIDE: 109 mmol/L (ref 101–111)
CO2: 26 mmol/L (ref 22–32)
Calcium: 8.8 mg/dL — ABNORMAL LOW (ref 8.9–10.3)
Creatinine, Ser: 0.63 mg/dL (ref 0.44–1.00)
GFR calc Af Amer: >60 mL/min (ref >60)
Glucose, Bld: 101 mg/dL — ABNORMAL HIGH (ref 65–99)
POTASSIUM: 3.3 mmol/L — AB (ref 3.5–5.1)
Sodium: 142 mmol/L (ref 135–145)
TOTAL PROTEIN: 6.1 g/dL — AB (ref 6.5–8.1)
Total Bilirubin: 0.7 mg/dL (ref 0.3–1.2)

## 2016-06-22 LAB — CK: CK TOTAL: 925 U/L — AB (ref 38–234)

## 2016-06-22 LAB — GLUCOSE, CAPILLARY: GLUCOSE-CAPILLARY: 98 mg/dL (ref 65–99)

## 2016-06-22 MED ORDER — POTASSIUM CHLORIDE CRYS ER 20 MEQ PO TBCR
40.0000 meq | EXTENDED_RELEASE_TABLET | Freq: Once | ORAL | Status: AC
  Administered 2016-06-22: 40 meq via ORAL
  Filled 2016-06-22: qty 2

## 2016-06-22 MED ORDER — POTASSIUM CHLORIDE CRYS ER 20 MEQ PO TBCR: 20.0000 meq | EXTENDED_RELEASE_TABLET | Freq: Every day | ORAL | 0 refills | Status: DC

## 2016-06-22 MED ORDER — MECLIZINE HCL 12.5 MG PO TABS: 12.5000 mg | ORAL_TABLET | Freq: Two times a day (BID) | ORAL | 0 refills | Status: DC | PRN

## 2016-06-22 MED ORDER — MECLIZINE HCL 25 MG PO TABS
12.5000 mg | ORAL_TABLET | Freq: Two times a day (BID) | ORAL | Status: DC | PRN
  Administered 2016-06-22: 12.5 mg via ORAL
  Filled 2016-06-22: qty 1

## 2016-06-22 NOTE — Discharge Summary (Addendum)
Physician Discharge Summary  [name] MRN: [id number] DOB/AGE: 09/09/1929 82 y.o.  PCP: VELAZQUEZ,GRETCHEN, MD   Admit date: 06/18/2016 Discharge date: 06/22/2016  Discharge Diagnoses:    Active Problems:   Altered mental status   Syncope and collapse   Cough   Fall   Abrasion   Contusion of right elbow Vitamin B-12 deficiency   Follow-up recommendations Follow-up with PCP in 3-5 days , including all  additional recommended appointments as below Follow-up CBC, CMP in 3-5 days Continue to monitor vitamin B-12   Dysphagia 3 (Mech soft);Thin liquid (chop meats)   Liquid Administration via: Cup Medication Administration: Whole meds with puree Supervision: Staff to assist with self feeding;Patient able to self feed;Full supervision/cueing for compensatory strategies Compensations: Minimize environmental distractions;Slow rate;Small sips/bites;Follow solids with liquid Postural Changes: Seated upright at 90 degrees;Remain upright for at least 30 minutes after po intake      Current Discharge Medication List    START taking these medications   Details  meclizine (ANTIVERT) 12.5 MG tablet Take 1 tablet (12.5 mg total) by mouth 2 (two) times daily as needed for dizziness. Qty: 30 tablet, Refills: 0    potassium chloride SA (K-DUR,KLOR-CON) 20 MEQ tablet Take 1 tablet (20 mEq total) by mouth daily. Qty: 1 tablet, Refills: 0      CONTINUE these medications which have NOT CHANGED   Details  acetaminophen (TYLENOL) 325 MG tablet Take 650 mg by mouth every 6 (six) hours as needed for moderate pain.    donepezil (ARICEPT) 10 MG tablet Take 10 mg by mouth daily.     levothyroxine (SYNTHROID, LEVOTHROID) 25 MCG tablet Take 25 mcg by mouth daily before breakfast.     Vitamin D, Ergocalciferol, (DRISDOL) 50000 units CAPS capsule TAKE ONE CAPSULE BY MOUTH EVERY WEEK. MAY TAKE EACH FRIDAY WITH LOSARTAN AND DONEPEZIL Refills: 11      STOP taking these medications      losartan (COZAAR) 50 MG tablet          Discharge Condition: Stable Discharge Instructions Get Medicines reviewed and adjusted: Please take all your medications with you for your next visit with your Primary MD  Please request your Primary MD to go over all hospital tests and procedure/radiological results at the follow up, please ask your Primary MD to get all Hospital records sent to his/her office.  If you experience worsening of your admission symptoms, develop shortness of breath, life threatening emergency, suicidal or homicidal thoughts you must seek medical attention immediately by calling 911 or calling your MD immediately if symptoms less severe.  You must read complete instructions/literature along with all the possible adverse reactions/side effects for all the Medicines you take and that have been prescribed to you. Take any new Medicines after you have completely understood and accpet all the possible adverse reactions/side effects.   Do not drive when taking Pain medications.   Do not take more than prescribed Pain, Sleep and Anxiety Medications  Special Instructions: If you have smoked or chewed Tobacco in the last 2 yrs please stop smoking, stop any regular Alcohol and or any Recreational drug use.  Wear Seat belts while driving.  Please note  You were cared for by a hospitalist during your hospital stay. Once you are discharged, your primary care physician will handle any further medical issues. Please note that NO REFILLS for any discharge medications will be authorized once you are discharged, as it is imperative that you return to your primary care physician (or establish   a relationship with a primary care physician if you do not have one) for your aftercare needs so that they can reassess your need for medications and monitor your lab values.     No Known Allergies    Disposition: 01-Home or Self Care   Consults:      Significant Diagnostic  Studies:  Dg Chest 2 View  Result Date: 06/19/2016 CLINICAL DATA:  Cough EXAM: CHEST  2 VIEW COMPARISON:  01/21/2015 FINDINGS: Cardiomediastinal silhouette is stable. Elevation of the left hemidiaphragm again noted. No infiltrate or pulmonary edema. Osteopenia and degenerative changes thoracic spine. IMPRESSION: No infiltrate or pulmonary edema. Chronic elevation of the left hemidiaphragm. Degenerative changes thoracic spine. Electronically Signed   By: Liviu  Pop M.D.   On: 06/19/2016 11:18   Dg Pelvis 1-2 Views  Result Date: 06/18/2016 CLINICAL DATA:  Status post fall.  Right-sided pain. EXAM: PELVIS - 1-2 VIEW COMPARISON:  None. FINDINGS: There is no evidence of pelvic fracture or diastasis. No pelvic bone lesions are seen. IMPRESSION: Negative. Electronically Signed   By: Dobrinka  Dimitrova M.D.   On: 06/18/2016 13:34   Dg Elbow Complete Right  Result Date: 06/18/2016 CLINICAL DATA:  RIGHT elbow pain post fall this morning EXAM: RIGHT ELBOW - COMPLETE 3+ VIEW COMPARISON:  None FINDINGS: Osseous demineralization. Radiocapitellar joint space narrowing and mild capitellar irregularity compatible with degenerative changes. Small olecranon spur. No acute fracture, dislocation, or bone destruction. No elbow joint effusion. IMPRESSION: Radiocapitellar degenerative changes. No acute bony abnormalities. Electronically Signed   By: Mark  Boles M.D.   On: 06/18/2016 12:09   Ct Head Wo Contrast  Result Date: 06/18/2016 CLINICAL DATA:  Fall this morning EXAM: CT HEAD WITHOUT CONTRAST CT CERVICAL SPINE WITHOUT CONTRAST TECHNIQUE: Multidetector CT imaging of the head and cervical spine was performed following the standard protocol without intravenous contrast. Multiplanar CT image reconstructions of the cervical spine were also generated. COMPARISON:  10/16/2005 FINDINGS: CT HEAD FINDINGS Brain: Mild chronic ischemic changes in the periventricular white matter. Small calcified extra-axial mass over the  right parietal lobe is stable. There is no mass effect, midline shift, or acute hemorrhage. Vascular: No hyperdense vessel or unexpected calcification. Skull: Cranium is intact. Sinuses/Orbits: There is an air-fluid level in the left maxillary sinus. There is mucosal thickening in the ethmoid air cells. Mastoid air cells are clear. Sphenoid sinuses clear. Other: Noncontributory CT CERVICAL SPINE FINDINGS Alignment: Anatomic alignment. Skull base and vertebrae: No acute fracture. No primary bone lesion or focal pathologic process. Soft tissues and spinal canal: No prevertebral fluid or swelling. No visible canal hematoma. Disc levels: In no evidence of spinal stenosis. Left-sided facet arthropathy occurs throughout the upper and mid cervical spine. Upper chest: Negative. Other: Noncontributory IMPRESSION: No acute intracranial pathology. No evidence of cervical spine injury. Electronically Signed   By: Arthur  Hoss M.D.   On: 06/18/2016 14:13   Ct Cervical Spine Wo Contrast  Result Date: 06/18/2016 CLINICAL DATA:  Fall this morning EXAM: CT HEAD WITHOUT CONTRAST CT CERVICAL SPINE WITHOUT CONTRAST TECHNIQUE: Multidetector CT imaging of the head and cervical spine was performed following the standard protocol without intravenous contrast. Multiplanar CT image reconstructions of the cervical spine were also generated. COMPARISON:  10/16/2005 FINDINGS: CT HEAD FINDINGS Brain: Mild chronic ischemic changes in the periventricular white matter. Small calcified extra-axial mass over the right parietal lobe is stable. There is no mass effect, midline shift, or acute hemorrhage. Vascular: No hyperdense vessel or unexpected calcification. Skull: Cranium is intact. Sinuses/Orbits:   There is an air-fluid level in the left maxillary sinus. There is mucosal thickening in the ethmoid air cells. Mastoid air cells are clear. Sphenoid sinuses clear. Other: Noncontributory CT CERVICAL SPINE FINDINGS Alignment: Anatomic alignment.  Skull base and vertebrae: No acute fracture. No primary bone lesion or focal pathologic process. Soft tissues and spinal canal: No prevertebral fluid or swelling. No visible canal hematoma. Disc levels: In no evidence of spinal stenosis. Left-sided facet arthropathy occurs throughout the upper and mid cervical spine. Upper chest: Negative. Other: Noncontributory IMPRESSION: No acute intracranial pathology. No evidence of cervical spine injury. Electronically Signed   By: Arthur  Hoss M.D.   On: 06/18/2016 14:13    Echocardiogram LV EF: 55% -   60%  ------------------------------------------------------------------- Indications:      Syncope 780.2.  ------------------------------------------------------------------- History:   Risk factors:  Hypertension. Diabetes mellitus.  ------------------------------------------------------------------- Study Conclusions  - Left ventricle: The cavity size was normal. Systolic function was   normal. The estimated ejection fraction was in the range of 55%   to 60%. There was dynamic obstruction. There was dynamic   obstruction, with a peak velocity of 109 cm/sec and a peak   gradient of 5 mm Hg. Wall motion was normal; there were no   regional wall motion abnormalities. Doppler parameters are   consistent with abnormal left ventricular relaxation (grade 1   diastolic dysfunction). Doppler parameters are consistent with   indeterminate ventricular filling pressure. - Aortic valve: Transvalvular velocity was within the normal range.   There was no stenosis. There was no regurgitation. - Mitral valve: Transvalvular velocity was within the normal range.   There was no evidence for stenosis. There was no regurgitation. - Left atrium: The atrium was severely dilated. - Right ventricle: The cavity size was normal. Wall thickness was   normal. Systolic function was normal. - Tricuspid valve: There was trivial regurgitation.   Filed Weights   06/20/16  0500 06/20/16 0702 06/21/16 0553  Weight: 64.9 kg (143 lb 1.3 oz) 62.4 kg (137 lb 9.1 oz) 61.1 kg (134 lb 11.2 oz)     Microbiology: Recent Results (from the past 240 hour(s))  Urine culture     Status: None   Collection Time: 06/18/16  3:03 PM  Result Value Ref Range Status   Specimen Description URINE, RANDOM  Final   Special Requests NONE  Final   Culture NO GROWTH Performed at [facility]   Final   Report Status 06/19/2016 FINAL  Final  C difficile quick scan w PCR reflex     Status: None   Collection Time: 06/20/16  5:11 PM  Result Value Ref Range Status   C Diff antigen NEGATIVE NEGATIVE Final   C Diff toxin NEGATIVE NEGATIVE Final   C Diff interpretation No C. difficile detected.  Final       Blood Culture    Component Value Date/Time   SDES URINE, RANDOM 06/18/2016 1503   SPECREQUEST NONE 06/18/2016 1503   CULT NO GROWTH Performed at [facility]  06/18/2016 1503   REPTSTATUS 06/19/2016 FINAL 06/18/2016 1503      Labs: Results for orders placed or performed during the hospital encounter of 06/18/16 (from the past 48 hour(s))  C difficile quick scan w PCR reflex     Status: None   Collection Time: 06/20/16  5:11 PM  Result Value Ref Range   C Diff antigen NEGATIVE NEGATIVE   C Diff toxin NEGATIVE NEGATIVE   C Diff interpretation No C.   difficile detected.   CK     Status: Abnormal   Collection Time: 06/21/16  4:51 AM  Result Value Ref Range   Total CK 2,012 (H) 38 - 234 U/L  Basic metabolic panel     Status: Abnormal   Collection Time: 06/21/16  4:51 AM  Result Value Ref Range   Sodium 142 135 - 145 mmol/L   Potassium 3.8 3.5 - 5.1 mmol/L   Chloride 109 101 - 111 mmol/L   CO2 25 22 - 32 mmol/L   Glucose, Bld 101 (H) 65 - 99 mg/dL   BUN 13 6 - 20 mg/dL   Creatinine, Ser 0.56 0.44 - 1.00 mg/dL   Calcium 8.6 (L) 8.9 - 10.3 mg/dL   GFR calc non Af Amer >60 >60 mL/min   GFR calc Af Amer >60 >60 mL/min    Comment: (NOTE) The eGFR has  been calculated using the CKD EPI equation. This calculation has not been validated in all clinical situations. eGFR's persistently <60 mL/min signify possible Chronic Kidney Disease.    Anion gap 8 5 - 15  Glucose, capillary     Status: None   Collection Time: 06/21/16  7:34 AM  Result Value Ref Range   Glucose-Capillary 87 65 - 99 mg/dL  CBC     Status: Abnormal   Collection Time: 06/22/16  3:59 AM  Result Value Ref Range   WBC 4.4 4.0 - 10.5 K/uL   RBC 3.95 3.87 - 5.11 MIL/uL   Hemoglobin 11.7 (L) 12.0 - 15.0 g/dL   HCT 34.9 (L) 36.0 - 46.0 %   MCV 88.4 78.0 - 100.0 fL   MCH 29.6 26.0 - 34.0 pg   MCHC 33.5 30.0 - 36.0 g/dL   RDW 13.3 11.5 - 15.5 %   Platelets 211 150 - 400 K/uL  Comprehensive metabolic panel     Status: Abnormal   Collection Time: 06/22/16  3:59 AM  Result Value Ref Range   Sodium 142 135 - 145 mmol/L   Potassium 3.3 (L) 3.5 - 5.1 mmol/L   Chloride 109 101 - 111 mmol/L   CO2 26 22 - 32 mmol/L   Glucose, Bld 101 (H) 65 - 99 mg/dL   BUN 14 6 - 20 mg/dL   Creatinine, Ser 0.63 0.44 - 1.00 mg/dL   Calcium 8.8 (L) 8.9 - 10.3 mg/dL   Total Protein 6.1 (L) 6.5 - 8.1 g/dL   Albumin 3.4 (L) 3.5 - 5.0 g/dL   AST 49 (H) 15 - 41 U/L   ALT 32 14 - 54 U/L   Alkaline Phosphatase 41 38 - 126 U/L   Total Bilirubin 0.7 0.3 - 1.2 mg/dL   GFR calc non Af Amer >60 >60 mL/min   GFR calc Af Amer >60 >60 mL/min    Comment: (NOTE) The eGFR has been calculated using the CKD EPI equation. This calculation has not been validated in all clinical situations. eGFR's persistently <60 mL/min signify possible Chronic Kidney Disease.    Anion gap 7 5 - 15  Glucose, capillary     Status: None   Collection Time: 06/22/16  7:39 AM  Result Value Ref Range   Glucose-Capillary 98 65 - 99 mg/dL       HPI   Emily Heath is a 82 y.o. female with medical history significant of HTN, HLD, Diabetes Mellitus, Hypothyroidism, Dementia, and other comorbids who presented from home with  Confusion and Unwitnessed Fall and unknown time of laying on the floor. Patient  is pleasantly demented and able to answer questions however is confused and only oriented to place. Does not know her son's name and could be the dementia. Most of the history was gathered from patient's son who is present at bedside. Patient lives by herself and was in normal state of health last night and patient's son left around 11:00pm. This morning when her Home Health Nurse went to check on her patient did not answer the door so she called the son. Son came and patient was found laying on the floor on her right side. Unclear whether she tripped or not. Patient's son got her up and to a chair and gave her food and tylenol because she was complaining of pain. She was acutely more confused than baseline per son so she was brought to the ED for evaluation. She was worked up and found to have mild Rhabdoymyolysis, and admitted for Syncope with unwitnessed fall and Confusion/Acute Metabolic Encephalopathy.  ED Course: Worked up with Head and Neck Ct; Had Blood work and found to be in [place].   HOSPITAL COURSE:   Acute Metabolic Encephalopathy on Concomitant Dementia, improving  -Head CT Showed No acute intracranial pathology. No evidence of cervical spine injury. -UDS Negative -Urinalysis showed Negative Leukocytes, Negative Nitrites and 0-5 WBC; Urine Cx  negative Recent worsening of cognitive skills/memory as per son Continue Donepezil CT head wo contrast shows no acute pathology, no MRI done due to low suspicion for stroke TSH with NL EEG negative Chest x-ray negative  Vitamin B-12 deficiency B-12 level 229 Patient would benefit from supplementation orally   Cough DG chest x-ray negative Patient is 100% on room air   Syncope with Unwitnessed Fall -Head CT Negative -Patient does not know how she ended up on the floor and unknown how long she was laying on floor; There is no evidence of pelvic fracture  or diastasis. No pelvic bone lesions are seen on Pelvis X-Ray and Elbow X-Ray showed Radiocapitellar degenerative changes. No acute bony abnormalities. -ECHOcardiogram LV EF: 55% - 60% -Telemetry and Neuro Checks stable  -Troponin I x 3 -PT/OT to Evaluate and Treat- recommend SNF vitamin B12  229, borderline low, started on supplementation   Mild Rhabdomyolysis from laying on floor from Fall -CK was 6,568>1275 >3150  >2012 and CK MB was 27.1; Troponin I poc was 0.03      Hypothyroidism   TSH and Free T4 is NL -C/w Levothyroxine 25 mcg po Daily  Diabetes Mellitus Type 2  -Patient's BS was 122 on Admission  HbA1c 6.0 Continue to monitor Accu-Cheks once a day  Hypertension Resume medications -Continue to Monitor BP's   Hypokalemia-repleted   Discharge Exam:   Blood pressure (!) 150/51, pulse 76, temperature 98.1 F (36.7 C), temperature source Oral, resp. rate 20, height 4' 10" (1.473 m), weight 61.1 kg (134 lb 11.2 oz), SpO2 97 %. General exam: Appears calm and comfortable  Respiratory system: Clear to auscultation. Respiratory effort normal. Cardiovascular system: S1 & S2 heard, RRR. No JVD, murmurs, rubs, gallops or clicks. No pedal edema. Gastrointestinal system: Abdomen is nondistended, soft and nontender. No organomegaly or masses felt. Normal bowel sounds heard. Central nervous system: Alert and oriented. No focal neurological deficits. Extremities: Symmetric 5 x 5 power. Skin: No rashes, lesions or ulcers Psychiatry: Judgement and insight appear normal. Mood & affect appropriate.      Follow-up Information    VELAZQUEZ,GRETCHEN, MD. Call in 2 day(s).   Specialty:  Internal Medicine Why:  Hospital follow-up  Contact information: [address] [place] [address] [phone]           Signed: , 06/22/2016, 8:39 AM        Time spent >45 mins   

## 2016-06-22 NOTE — Progress Notes (Signed)
Occupational Therapy Treatment Patient Details Name: Emily Heath MRN: [id number] DOB: 17-Nov-1929 Today's Date: 06/22/2016    History of present illness 82 y.o. female with medical history significant of HTN, HLD, Diabetes Mellitus, Hypothyroidism, Dementia who presented from home with Confusion and Unwitnessed Fall and unknown time of laying on the floor. Dx of mild rhabdomyosysis, acute encephalopathy   OT comments  Pt making progress with functional goals. OT will continue to follow  Follow Up Recommendations  SNF    Equipment Recommendations  None recommended by OT    Recommendations for Other Services      Precautions / Restrictions Precautions Precautions: Fall Restrictions Weight Bearing Restrictions: No       Mobility Bed Mobility Overal bed mobility: Needs Assistance Bed Mobility: Sit to Supine;Supine to Sit     Supine to sit: Min assist Sit to supine: Min assist   General bed mobility comments: assist for elevating trunk and LEs onto bed   Transfers Overall transfer level: Needs assistance Equipment used: Rolling walker (2 wheeled) Transfers: Sit to/from Stand Sit to Stand: Min assist Stand pivot transfers: Min assist       General transfer comment: pt reports no dizziness with ambulation to bathroom and back to bed    Balance Overall balance assessment: Needs assistance   Sitting balance-Leahy Scale: Fair       Standing balance-Leahy Scale: Fair                     ADL Overall ADL's : Needs assistance/impaired     Grooming: Wash/dry hands;Min guard;Standing;Wash/dry face   Upper Body Bathing: Minimal assistance;Sitting (simulated)   Lower Body Bathing: Maximal assistance;Sit to/from stand;Moderate assistance (simulated)           Toilet Transfer: Min guard;Ambulation;Comfort height toilet;RW   Toileting- Water quality scientist and Hygiene: Min guard;Sit to/from stand       Functional mobility during ADLs: Min  guard;Minimal assistance General ADL Comments: pt reports no dizziness during ADL mobility                                  Cognition   Behavior During Therapy: WFL for tasks assessed/performed Overall Cognitive Status: History of cognitive impairments - at baseline                  General Comments: h/o dementia    Extremity/Trunk Assessment   generalized weakness                        General Comments  pt very pleasant and cooperative    Pertinent Vitals/ Pain       Pain Assessment: No/denies pain                                                          Frequency  Min 2X/week        Progress Toward Goals  OT Goals(current goals can now be found in the care plan section)  Progress towards OT goals: Progressing toward goals     Plan Discharge plan remains appropriate                     End of Session Equipment Utilized During Treatment:  Gait belt;Rolling walker   Activity Tolerance Patient tolerated treatment well   Patient Left in bed;with call bell/phone within reach;with bed alarm set;with family/visitor present   Nurse Communication          Time: [id number] OT Time Calculation (min): 17 min  Charges: OT General Charges $OT Visit: 1 Procedure OT Treatments $Self Care/Home Management : 8-22 mins  Britt Bottom 06/22/2016, 12:57 PM

## 2016-06-22 NOTE — Progress Notes (Signed)
Physical Therapy Treatment Patient Details Name: Chrissette Bookwalter MRN: [id number] DOB: 1930-06-17 Today's Date: Jul 16, 2016    History of Present Illness 82 y.o. female with medical history significant of HTN, HLD, Diabetes Mellitus, Hypothyroidism, Dementia who presented from home with Confusion and Unwitnessed Fall and unknown time of laying on the floor. Dx of mild rhabdomyosysis, acute encephalopathy    PT Comments    Pt up in recliner on arrival and reported dizziness.  Pt assisted to standing and dizziness continued (monitored BP see mobility section below).  Pt declined ambulation and requested assist back to bed.  MD into room after session and aware of dizziness and BP.  Possible d/c to SNF today.   Follow Up Recommendations  Supervision/Assistance - 24 hour;SNF     Equipment Recommendations  Rolling walker with 5" wheels    Recommendations for Other Services       Precautions / Restrictions Precautions Precautions: Fall    Mobility  Bed Mobility Overal bed mobility: Needs Assistance Bed Mobility: Sit to Supine       Sit to supine: Min assist   General bed mobility comments: assist for LEs onto bed   Transfers Overall transfer level: Needs assistance Equipment used: Rolling walker (2 wheeled) Transfers: Sit to/from [facility] Sit to Stand: Min assist Stand pivot transfers: Min assist       General transfer comment: pt reports dizziness in recliner, BP 142/56 mmHg and during standing, BP 142/44mmHg, HR 83 bpm, performed standing twice however pt felt unable to continue and requested transfer back to bed.  Ambulation/Gait                 Stairs            Wheelchair Mobility    Modified Rankin (Stroke Patients Only)       Balance                                    Cognition Arousal/Alertness: Awake/alert Behavior During Therapy: WFL for tasks assessed/performed Overall Cognitive Status: History of  cognitive impairments - at baseline                 General Comments: h/o dementia    Exercises      General Comments        Pertinent Vitals/Pain Pain Assessment: No/denies pain    Home Living                      Prior Function            PT Goals (current goals can now be found in the care plan section) Progress towards PT goals: Progressing toward goals    Frequency    Min 3X/week      PT Plan Current plan remains appropriate    Co-evaluation             End of Session Equipment Utilized During Treatment: Gait belt Activity Tolerance: Other (comment) (reports dizziness today) Patient left: with call bell/phone within reach;with family/visitor present;in bed;with bed alarm set     Time: [id number] PT Time Calculation (min) (ACUTE ONLY): 19 min  Charges:  $Therapeutic Activity: 8-22 mins                    G Codes:      Annalynne Ibanez,KATHrine E 07/16/16, 12:50 PM Carmelia Bake, PT, DPT 07/16/16 Pager: [phone]

## 2016-06-22 NOTE — Progress Notes (Signed)
Discharge report called to Rudi Heap RN at receiving facility. Condition unchanged. Son provided transportation to SNF. Eulas Post, RN

## 2016-06-22 NOTE — Clinical Social Work Placement (Signed)
Patient is set to discharge to [facility] today. Patient & son, [name] aware. Discharge packet given to RN, Hinton Dyer. Son to transport at [place], [place] Social Worker cell #: [phone]    CLINICAL SOCIAL WORK PLACEMENT  NOTE  Date:  06/22/2016  Patient Details  Name: Emily Heath MRN: [id number] Date of Birth: [birth year]  Clinical Social Work is seeking post-discharge placement for this patient at the [place] level of care (*CSW will initial, date and re-position this form in  chart as items are completed):  Yes   Patient/family provided with [place] Work Department's list of facilities offering this level of care within the geographic area requested by the patient (or if unable, by the patient's family).  Yes   Patient/family informed of their freedom to choose among providers that offer the needed level of care, that participate in Medicare, Medicaid or managed care program needed by the patient, have an available bed and are willing to accept the patient.  Yes   Patient/family informed of [place] [facility]'s ownership interest in [facility] and [facility], as well as of the fact that they are under no obligation to receive care at these facilities.  PASRR submitted to EDS on 06/20/16     PASRR number received on 06/20/16     Existing PASRR number confirmed on       FL2 transmitted to all facilities in geographic area requested by pt/family on 06/20/16     FL2 transmitted to all facilities within larger geographic area on       Patient informed that his/her managed care company has contracts with or will negotiate with certain facilities, including the following:        Yes   Patient/family informed of bed offers received.  Patient chooses bed at [facility]     Physician recommends and patient chooses bed at      Patient to be transferred to [facility] on 06/22/16.  Patient to be transferred to facility by patient's son      Patient family notified on 06/22/16 of transfer.  Name of family member notified:  patient's son via phone     PHYSICIAN       Additional Comment:    _______________________________________________ Standley Brooking, LCSW 06/22/2016, 10:29 AM

## 2016-07-11 ENCOUNTER — Other Ambulatory Visit: Payer: Medicare Other

## 2016-07-11 NOTE — ED Provider Notes (Signed)
[facility] DEPT Provider Note   CSN: [id number] Arrival date & time: 06/18/16  1129     History   Chief Complaint Chief Complaint  Patient presents with  . Elbow Pain    HPI Emily Heath is a 83 y.o. female.  This is an 83 year old female lives by herself.  She does have a caregiver that comes in for 8 hours a day.  Son states that he was with her from 9-11 last night, which is the last time she was seen at her normal state of health when he went to check on her this morning, she was found lying on the floor in the bathroom in her pajamas facedown.  She was assisted to her feet.  She has been able to ambulate with assistance, since she's complaining of right elbow pain.  He did notice when he changed her close that she had an abrasion to the lateral malleolus and lateral aspect of her knee on the right.  Her right cheek is also reddened.  He states that this is the side of the face that was on the floor.  Patient is unable to contribute to her history  or time of fall or what made her fall      Past Medical History:  Diagnosis Date  . Anxiety   . Depression   . Diabetes mellitus without complication ([place])   . Hyperlipemia   . Hypertension     Patient Active Problem List   Diagnosis Date Noted  . Abrasion   . Contusion of right elbow   . Cough   . Fall   . Altered mental status 06/18/2016  . Syncope and collapse 06/18/2016  . Dementia with behavioral disturbance 05/12/2016  . ONYCHOMYCOSIS 12/19/2006  . HYPERLIPIDEMIA 12/19/2006  . MENINGIOMA 10/04/2006  . HYPOTHYROIDISM 10/04/2006  . OSTEOPENIA 10/04/2006    Past Surgical History:  Procedure Laterality Date  . no surgerical history      OB History    No data available       Home Medications    Prior to Admission medications   Medication Sig Start Date End Date Taking? Authorizing Provider  acetaminophen (TYLENOL) 325 MG tablet Take 650 mg by mouth every 6 (six) hours as needed for moderate pain.    Yes Historical Provider, MD  donepezil (ARICEPT) 10 MG tablet Take 10 mg by mouth daily.  05/29/16  Yes Historical Provider, MD  levothyroxine (SYNTHROID, LEVOTHROID) 25 MCG tablet Take 25 mcg by mouth daily before breakfast.  05/29/16  Yes Historical Provider, MD  meclizine (ANTIVERT) 12.5 MG tablet Take 1 tablet (12.5 mg total) by mouth 2 (two) times daily as needed for dizziness. 06/22/16   [name], MD  potassium chloride SA (K-DUR,KLOR-CON) 20 MEQ tablet Take 1 tablet (20 mEq total) by mouth daily. 06/22/16   [name], MD  Vitamin D, Ergocalciferol, (DRISDOL) 50000 units CAPS capsule TAKE ONE CAPSULE BY MOUTH EVERY WEEK. [place] WITH LOSARTAN AND DONEPEZIL 05/29/16   Historical Provider, MD    Family History Family History  Problem Relation Age of Onset  . Cancer Mother   . Alzheimer's disease Neg Hx     Social History Social History  Substance Use Topics  . Smoking status: Never Smoker  . Smokeless tobacco: Never Used  . Alcohol use No     Allergies   Patient has no known allergies.   Review of Systems Review of Systems  Unable to perform ROS: Dementia  Constitutional: Negative.  Negative for fever.  Neurological: Negative for dizziness and headaches.  All other systems reviewed and are negative.    Physical Exam Updated Vital Signs BP (!) 150/51 (BP Location: Left Arm)   Pulse 76   Temp 98.1 F (36.7 C) (Oral)   Resp 20   Ht 4\' 10"  (1.473 m)   Wt 134 lb 11.2 oz (61.1 kg)   SpO2 97%   BMI 28.15 kg/m   Physical Exam  HENT:  Head: Normocephalic.    Eyes: Pupils are equal, round, and reactive to light.  Neck: Normal range of motion.  Cardiovascular: Normal rate.   Pulmonary/Chest: Effort normal and breath sounds normal. She exhibits no tenderness.  Abdominal: Soft. Bowel sounds are normal. She exhibits no distension. There is no tenderness.  Musculoskeletal: Normal range of motion. She exhibits tenderness.       Right elbow: She  exhibits normal range of motion.       Arms:      Legs: Nursing note and vitals reviewed.    ED Treatments / Results  Labs (all labs ordered are listed, but only abnormal results are displayed) Labs Reviewed  URINALYSIS, ROUTINE W REFLEX MICROSCOPIC - Abnormal; Notable for the following:       Result Value   Glucose, UA 50 (*)    Hgb urine dipstick LARGE (*)    Ketones, ur 5 (*)    Protein, ur 30 (*)    All other components within normal limits  CK TOTAL AND CKMB (NOT AT [facility]) - Abnormal; Notable for the following:    Total CK 3,903 (*)    CK, MB 27.1 (*)    All other components within normal limits  CBC WITH DIFFERENTIAL/PLATELET - Abnormal; Notable for the following:    Lymphs Abs 0.3 (*)    All other components within normal limits  HEMOGLOBIN A1C - Abnormal; Notable for the following:    Hgb A1c MFr Bld 6.0 (*)    All other components within normal limits  CK TOTAL AND CKMB (NOT AT [facility]) - Abnormal; Notable for the following:    Total CK 4,414 (*)    CK, MB 12.2 (*)    All other components within normal limits  COMPREHENSIVE METABOLIC PANEL - Abnormal; Notable for the following:    Potassium 3.4 (*)    Glucose, Bld 129 (*)    BUN 21 (*)    Calcium 8.5 (*)    Albumin 3.4 (*)    AST 66 (*)    All other components within normal limits  CBC - Abnormal; Notable for the following:    Hemoglobin 11.8 (*)    HCT 35.5 (*)    All other components within normal limits  TROPONIN I - Abnormal; Notable for the following:    Troponin I 0.04 (*)    All other components within normal limits  TROPONIN I - Abnormal; Notable for the following:    Troponin I 0.04 (*)    All other components within normal limits  TROPONIN I - Abnormal; Notable for the following:    Troponin I 0.03 (*)    All other components within normal limits  GLUCOSE, CAPILLARY - Abnormal; Notable for the following:    Glucose-Capillary 120 (*)    All other components within normal limits  COMPREHENSIVE  METABOLIC PANEL - Abnormal; Notable for the following:    Glucose, Bld 106 (*)    Calcium 8.4 (*)    Total Protein 6.0 (*)    Albumin  3.3 (*)    AST 63 (*)    All other components within normal limits  CK - Abnormal; Notable for the following:    Total CK 3,150 (*)    All other components within normal limits  CK - Abnormal; Notable for the following:    Total CK 2,012 (*)    All other components within normal limits  BASIC METABOLIC PANEL - Abnormal; Notable for the following:    Glucose, Bld 101 (*)    Calcium 8.6 (*)    All other components within normal limits  CBC - Abnormal; Notable for the following:    Hemoglobin 11.7 (*)    HCT 34.9 (*)    All other components within normal limits  COMPREHENSIVE METABOLIC PANEL - Abnormal; Notable for the following:    Potassium 3.3 (*)    Glucose, Bld 101 (*)    Calcium 8.8 (*)    Total Protein 6.1 (*)    Albumin 3.4 (*)    AST 49 (*)    All other components within normal limits  CK - Abnormal; Notable for the following:    Total CK 925 (*)    All other components within normal limits  I-STAT CHEM 8, ED - Abnormal; Notable for the following:    Glucose, Bld 122 (*)    Calcium, Ion 1.06 (*)    All other components within normal limits  URINE CULTURE  C DIFFICILE QUICK SCREEN W PCR REFLEX  RAPID URINE DRUG SCREEN, HOSP PERFORMED  CREATININE, SERUM  TSH  T4, FREE  CBC  VITAMIN B12  GLUCOSE, CAPILLARY  GLUCOSE, CAPILLARY  GLUCOSE, CAPILLARY  I-STAT TROPOININ, ED    EKG  EKG Interpretation  Date/Time:  Monday June 18 2016 13:35:56 EST Ventricular Rate:  79 PR Interval:    QRS Duration: 80 QT Interval:  412 QTC Calculation: 473 R Axis:   11 Text Interpretation:  Sinus rhythm Abnormal R-wave progression, early transition Borderline T abnormalities, inferior leads Confirmed by Lacinda Axon  MD, Charee Tumblin ([id number]) on 06/18/2016 1:55:09 PM       Radiology No results found.  Procedures Procedures (including critical care  time)  Medications Ordered in ED Medications  potassium chloride SA (K-DUR,KLOR-CON) CR tablet 40 mEq (40 mEq Oral Given 06/19/16 1842)  cyanocobalamin ((VITAMIN B-12)) injection 1,000 mcg (1,000 mcg Intramuscular Given 06/21/16 1035)  potassium chloride SA (K-DUR,KLOR-CON) CR tablet 40 mEq (40 mEq Oral Given 06/22/16 1113)     Initial Impression / Assessment and Plan / ED Course  I have reviewed the triage vital signs and the nursing notes.  Pertinent labs & imaging results that were available during my care of the patient were reviewed by me and considered in my medical decision making (see chart for details).  Clinical Course    Status post syncopal event and fall. Patient is unable to care for self. Will admit to telemetry.    Final Clinical Impressions(s) / ED Diagnoses   Final diagnoses:  Altered mental status, unspecified altered mental status type  Fall, initial encounter  Contusion of right elbow, initial encounter  Abrasion    New Prescriptions Discharge Medication List as of 06/22/2016 12:34 PM    START taking these medications   Details  meclizine (ANTIVERT) 12.5 MG tablet Take 1 tablet (12.5 mg total) by mouth 2 (two) times daily as needed for dizziness., Starting Fri 06/22/2016, Normal    potassium chloride SA (K-DUR,KLOR-CON) 20 MEQ tablet Take 1 tablet (20 mEq total) by mouth daily., Starting  Fri 06/22/2016, Normal         Junius Creamer, NP 06/18/16 1614 Medical screening examination/treatment/procedure(s) were conducted as a shared visit with non-physician practitioner(s) and myself.  I personally evaluated the patient during the encounter.   EKG Interpretation  Date/Time:  Monday June 18 2016 13:35:56 EST Ventricular Rate:  79 PR Interval:    QRS Duration: 80 QT Interval:  412 QTC Calculation: 473 R Axis:   11 Text Interpretation:  Sinus rhythm Abnormal R-wave progression, early transition Borderline T abnormalities, inferior leads Confirmed  by Lacinda Axon  MD, Burnett Spray ([id number]) on 06/18/2016 1:55:09 PM      Medical screening examination/treatment/procedure(s) were conducted as a shared visit with non-physician practitioner(s) and myself.  I personally evaluated the patient during the encounter.   EKG Interpretation  Date/Time:  Monday June 18 2016 13:35:56 EST Ventricular Rate:  79 PR Interval:    QRS Duration: 80 QT Interval:  412 QTC Calculation: 473 R Axis:   11 Text Interpretation:  Sinus rhythm Abnormal R-wave progression, early transition Borderline T abnormalities, inferior leads Confirmed by Illeana Edick  MD, Lindwood Mogel ([id number]) on 06/18/2016 1:55:09 PM     Status post syncopal event with fall. Patient is unable to care for self. Will admit to telemetry.   Nat Christen, MD 06/20/16 [place], MD 07/11/16 1322

## 2016-09-11 ENCOUNTER — Telehealth: Payer: Self-pay

## 2016-09-11 ENCOUNTER — Ambulatory Visit: Payer: Medicare Other | Admitting: Neurology

## 2016-09-11 NOTE — Telephone Encounter (Signed)
Pt no-showed her appt this afternoon.

## 2016-09-18 ENCOUNTER — Encounter: Payer: Self-pay | Admitting: Neurology

## 2016-09-27 ENCOUNTER — Telehealth: Payer: Self-pay | Admitting: Neurology

## 2016-09-27 NOTE — Telephone Encounter (Signed)
Pt daughter in law Dorothy Puffer) called to inform Dr Jaynee Eagles that the pt is now in a memory care unit as of Feb.

## 2016-11-24 ENCOUNTER — Emergency Department (HOSPITAL_COMMUNITY): Payer: Medicare Other

## 2016-11-24 ENCOUNTER — Encounter (HOSPITAL_COMMUNITY): Payer: Self-pay | Admitting: Emergency Medicine

## 2016-11-24 ENCOUNTER — Emergency Department (HOSPITAL_COMMUNITY)
Admission: EM | Admit: 2016-11-24 | Discharge: 2016-11-24 | Disposition: A | Discharge status: Skilled Nursing Facility | Payer: Medicare Other | Attending: Emergency Medicine | Admitting: Emergency Medicine

## 2016-11-24 DIAGNOSIS — I10 Essential (primary) hypertension: Secondary | ICD-10-CM | POA: Insufficient documentation

## 2016-11-24 DIAGNOSIS — E119 Type 2 diabetes mellitus without complications: Secondary | ICD-10-CM | POA: Diagnosis not present

## 2016-11-24 DIAGNOSIS — R059 Cough, unspecified: Secondary | ICD-10-CM

## 2016-11-24 DIAGNOSIS — R05 Cough: Secondary | ICD-10-CM | POA: Insufficient documentation

## 2016-11-24 DIAGNOSIS — Z79899 Other long term (current) drug therapy: Secondary | ICD-10-CM | POA: Insufficient documentation

## 2016-11-24 LAB — CBC WITH DIFFERENTIAL/PLATELET
Basophils Absolute: 0 10*3/uL (ref 0.0–0.1)
Basophils Relative: 0 %
Eosinophils Absolute: 0 10*3/uL (ref 0.0–0.7)
Eosinophils Relative: 0 %
HEMATOCRIT: 37.4 % (ref 36.0–46.0)
Hemoglobin: 12.3 g/dL (ref 12.0–15.0)
LYMPHS ABS: 1.4 10*3/uL (ref 0.7–4.0)
LYMPHS PCT: 26 %
MCH: 28.8 pg (ref 26.0–34.0)
MCHC: 32.9 g/dL (ref 30.0–36.0)
MCV: 87.6 fL (ref 78.0–100.0)
MONO ABS: 0.5 10*3/uL (ref 0.1–1.0)
MONOS PCT: 9 %
NEUTROS ABS: 3.4 10*3/uL (ref 1.7–7.7)
Neutrophils Relative %: 65 %
Platelets: 192 10*3/uL (ref 150–400)
RBC: 4.27 MIL/uL (ref 3.87–5.11)
RDW: 15 % (ref 11.5–15.5)
WBC: 5.3 10*3/uL (ref 4.0–10.5)

## 2016-11-24 LAB — BASIC METABOLIC PANEL
ANION GAP: 8 (ref 5–15)
BUN: 18 mg/dL (ref 6–20)
CALCIUM: 9 mg/dL (ref 8.9–10.3)
CO2: 26 mmol/L (ref 22–32)
Chloride: 107 mmol/L (ref 101–111)
Creatinine, Ser: 0.72 mg/dL (ref 0.44–1.00)
GFR calc Af Amer: >60 mL/min (ref >60)
GFR calc non Af Amer: >60 mL/min (ref >60)
GLUCOSE: 104 mg/dL — AB (ref 65–99)
POTASSIUM: 4.4 mmol/L (ref 3.5–5.1)
Sodium: 141 mmol/L (ref 135–145)

## 2016-11-24 LAB — I-STAT CG4 LACTIC ACID, ED: Lactic Acid, Venous: 0.95 mmol/L (ref 0.5–1.9)

## 2016-11-24 MED ORDER — IOPAMIDOL (ISOVUE-370) INJECTION 76%
INTRAVENOUS | Status: AC
  Administered 2016-11-24: 100 mL via INTRAVENOUS
  Filled 2016-11-24: qty 100

## 2016-11-24 NOTE — ED Provider Notes (Signed)
[facility] DEPT Provider Note   CSN: [id number] Arrival date & time: 11/24/16  1159     History   Chief Complaint Chief Complaint  Patient presents with  . Cough    HPI Emily Heath is a 83 y.o. female.  83yo f w/ PMH including dementia, HTN, HLD, T2DM, anxiety/depression who p/w cough. Nursing facility reports that 2 patients at their facility have had pneumonia recently and they were concerned because this patient has had a cough since February. She is currently on cough medications for that cough but has had decreased appetite recently and they were concerned that she may have pneumonia too.  Pt reports some mild SOB associated with the cough. She denies any complaints of pain or nausea.  LEVEL 5 CAVEAT DUE TO DEMENTIA    Cough     Past Medical History:  Diagnosis Date  . Anxiety   . Depression   . Diabetes mellitus without complication ([place])   . Hyperlipemia   . Hypertension     Patient Active Problem List   Diagnosis Date Noted  . Abrasion   . Contusion of right elbow   . Cough   . Fall   . Altered mental status 06/18/2016  . Syncope and collapse 06/18/2016  . Dementia with behavioral disturbance 05/12/2016  . ONYCHOMYCOSIS 12/19/2006  . HYPERLIPIDEMIA 12/19/2006  . MENINGIOMA 10/04/2006  . HYPOTHYROIDISM 10/04/2006  . OSTEOPENIA 10/04/2006    Past Surgical History:  Procedure Laterality Date  . no surgerical history      OB History    No data available       Home Medications    Prior to Admission medications   Medication Sig Start Date End Date Taking? Authorizing Provider  acetaminophen (TYLENOL) 325 MG tablet Take 650 mg by mouth every 6 (six) hours as needed for moderate pain.    [provider]  donepezil (ARICEPT) 10 MG tablet Take 10 mg by mouth daily.  05/29/16   [provider]  levothyroxine (SYNTHROID, LEVOTHROID) 25 MCG tablet Take 25 mcg by mouth daily before breakfast.  05/29/16   [provider]    meclizine (ANTIVERT) 12.5 MG tablet Take 1 tablet (12.5 mg total) by mouth 2 (two) times daily as needed for dizziness. 06/22/16   [name], MD  potassium chloride SA (K-DUR,KLOR-CON) 20 MEQ tablet Take 1 tablet (20 mEq total) by mouth daily. 06/22/16   [name], MD  Vitamin D, Ergocalciferol, (DRISDOL) 50000 units CAPS capsule TAKE ONE CAPSULE BY MOUTH EVERY WEEK. [place] WITH LOSARTAN AND DONEPEZIL 05/29/16   [provider]    Family History Family History  Problem Relation Age of Onset  . Cancer Mother   . Alzheimer's disease Neg Hx     Social History Social History  Substance Use Topics  . Smoking status: Never Smoker  . Smokeless tobacco: Never Used  . Alcohol use No     Allergies   Patient has no known allergies.   Review of Systems Review of Systems  Unable to perform ROS: Dementia  Respiratory: Positive for cough.      Physical Exam Updated Vital Signs BP (!) 138/47 (BP Location: Left Arm)   Pulse 75   Temp 98.2 F (36.8 C) (Oral)   Resp 18   Ht 5' (1.524 m)   Wt 59 kg (130 lb)   SpO2 96%   BMI 25.39 kg/m   Physical Exam  Constitutional: She is oriented to person, place, and time. She  appears well-developed and well-nourished. No distress.  Resting comfortably, occasional cough  HENT:  Head: Normocephalic and atraumatic.  Mildly dry mucous membranes  Eyes: Conjunctivae are normal. Pupils are equal, round, and reactive to light.  Neck: Neck supple.  Cardiovascular: Normal rate, regular rhythm and normal heart sounds.   No murmur heard. Pulmonary/Chest: Effort normal.  Crackles left lower lung  Abdominal: Soft. Bowel sounds are normal. She exhibits no distension. There is no tenderness.  Musculoskeletal: She exhibits edema (1+ pitting BLE).  Neurological: She is alert and oriented to person, place, and time.  Fluent speech  Skin: Skin is warm and dry.  Psychiatric: She has a normal mood and affect. Judgment normal.   Nursing note and vitals reviewed.    ED Treatments / Results  Labs (all labs ordered are listed, but only abnormal results are displayed) Labs Reviewed  BASIC METABOLIC PANEL - Abnormal; Notable for the following:       Result Value   Glucose, Bld 104 (*)    All other components within normal limits  CBC WITH DIFFERENTIAL/PLATELET  I-STAT CG4 LACTIC ACID, ED    EKG  EKG Interpretation None       Radiology Dg Chest 2 View  Result Date: 11/24/2016 CLINICAL DATA:  Cough decreased breath sounds on the left EXAM: CHEST  2 VIEW COMPARISON:  06/19/2016 FINDINGS: Cardiac shadow is stable. Elevation of the left hemidiaphragm is again seen. The lungs are well aerated bilaterally. No focal infiltrate is seen. Degenerative changes of thoracic spine are noted. A compression deformities noted in the lower thoracic spine new from the prior exam but overall chronic appearing. IMPRESSION: No acute infiltrate seen. Compression deformity in the lower thoracic spine new from the prior exam but within overall chronic appearance. Clinical correlation is recommended. Electronically Signed   By: Inez Catalina M.D.   On: 11/24/2016 13:58    Procedures Procedures (including critical care time)  Medications Ordered in ED Medications - No data to display   Initial Impression / Assessment and Plan / ED Course  I have reviewed the triage vital signs and the nursing notes.  Pertinent labs & imaging results that were available during my care of the patient were reviewed by me and considered in my medical decision making (see chart for details).    PT w/ h/o dementia sent from nursing facility for cough and decreased appetite, 2 other residents recently diagnosed w/ pneumonia. She was non-toxic on exam, afebrile, BP 107/65, HR 80, O2 91% on RA. Breathing comfortably, crackles in L lower lung.  Labs show normal lactate, unremarkable CBC and BMP. Chest x-ray without acute infiltrate. I'm concerned about the  patient's persistence of cough and crackles on exam, therefore obtained CT of chest for better evaluation. I'm signing out the oncoming provider who will follow-up on imaging. If imaging is negative I anticipate discharge.  Final Clinical Impressions(s) / ED Diagnoses   Final diagnoses:  None    New Prescriptions New Prescriptions   No medications on file     Zoie Sarin, Wenda Overland, MD 11/24/16 1554

## 2016-11-24 NOTE — ED Notes (Signed)
Spoke with son, Ed regarding pt plan of care. Will call son per request when CT results.

## 2016-11-24 NOTE — ED Notes (Addendum)
This Probation officer attempt to call [facility]; first call sent to answering service stating "unable to take call." Second attempt, someone answered transferred call and was disconnected. Attempt x3 with same previous answering service. Several attempts were made; pt to be transported via [facility] with discharge instructions per Artelia Laroche, charge RN.

## 2016-11-24 NOTE — ED Triage Notes (Addendum)
Per [facility] sent pt for evaluation related to decreased appetite for 2 days and cough. Per staff 2 other residents recently sent out for pneumonia. Pt has been treating cough since February. Pt hx of dementia per normal.

## 2016-11-24 NOTE — ED Notes (Signed)
Patient eating ham sandwich and drinking water.

## 2016-11-24 NOTE — ED Notes (Signed)
This Probation officer spoke with son ED regarding resulting and plan to discharge.

## 2016-11-24 NOTE — ED Notes (Signed)
Bed: ZS01 Expected date:  Expected time:  Means of arrival:  Comments: Gen weakness

## 2016-11-24 NOTE — ED Notes (Signed)
Artelia Laroche RN verbalizes will attempt US guided IV for 20 gauge catheter needed for CT Angio.

## 2016-11-24 NOTE — Discharge Instructions (Signed)
As discussed, your evaluation today has been largely reassuring.  But, it is important that you monitor your condition carefully, and do not hesitate to return to the ED if you develop new, or concerning changes in your condition. ? ?Otherwise, please follow-up with your physician for appropriate ongoing care. ? ?

## 2016-11-24 NOTE — ED Notes (Signed)
[facility] verbalizes could be an hour prior to transport.

## 2016-11-24 NOTE — ED Provider Notes (Signed)
5:45 PM Patient appears calm, comfortable.  CT unremarkable.  Patient will return to her [facility]    Carmin Muskrat, MD 11/24/16 1746

## 2016-11-25 ENCOUNTER — Emergency Department (HOSPITAL_COMMUNITY): Payer: Medicare Other

## 2016-11-25 ENCOUNTER — Emergency Department (HOSPITAL_COMMUNITY)
Admission: EM | Admit: 2016-11-25 | Discharge: 2016-11-25 | Disposition: A | Discharge status: Home / Self Care | Payer: Medicare Other | Attending: Emergency Medicine | Admitting: Emergency Medicine

## 2016-11-25 ENCOUNTER — Encounter (HOSPITAL_COMMUNITY): Payer: Self-pay | Admitting: Emergency Medicine

## 2016-11-25 DIAGNOSIS — I10 Essential (primary) hypertension: Secondary | ICD-10-CM | POA: Diagnosis not present

## 2016-11-25 DIAGNOSIS — Y92129 Unspecified place in nursing home as the place of occurrence of the external cause: Secondary | ICD-10-CM | POA: Insufficient documentation

## 2016-11-25 DIAGNOSIS — W1830XA Fall on same level, unspecified, initial encounter: Secondary | ICD-10-CM | POA: Insufficient documentation

## 2016-11-25 DIAGNOSIS — S0990XA Unspecified injury of head, initial encounter: Secondary | ICD-10-CM

## 2016-11-25 DIAGNOSIS — E039 Hypothyroidism, unspecified: Secondary | ICD-10-CM | POA: Insufficient documentation

## 2016-11-25 DIAGNOSIS — Y999 Unspecified external cause status: Secondary | ICD-10-CM | POA: Insufficient documentation

## 2016-11-25 DIAGNOSIS — W19XXXA Unspecified fall, initial encounter: Secondary | ICD-10-CM

## 2016-11-25 DIAGNOSIS — E119 Type 2 diabetes mellitus without complications: Secondary | ICD-10-CM | POA: Diagnosis not present

## 2016-11-25 DIAGNOSIS — Y939 Activity, unspecified: Secondary | ICD-10-CM | POA: Insufficient documentation

## 2016-11-25 DIAGNOSIS — Z79899 Other long term (current) drug therapy: Secondary | ICD-10-CM | POA: Diagnosis not present

## 2016-11-25 NOTE — ED Notes (Signed)
Bed: HM09 Expected date:  Expected time:  Means of arrival:  Comments: 83 yo fall; hematoma to head

## 2016-11-25 NOTE — ED Provider Notes (Signed)
[facility] DEPT Provider Note   CSN: [id number] Arrival date & time: 11/25/16  1825     History   Chief Complaint Chief Complaint  Patient presents with  . Fall    HPI Emily Heath is a 83 y.o. female. Level V caveat due to dementia. HPI Patient with reported fall. Fell at dinner. Falls is not unusual. She is from a nursing home. Reportedly hit back of her head. Complaining pain in the back for head. Denies neck pain. Denies loss conscious. No chest pain. No other injury.   Past Medical History:  Diagnosis Date  . Anxiety   . Depression   . Diabetes mellitus without complication ([place])   . Hyperlipemia   . Hypertension     Patient Active Problem List   Diagnosis Date Noted  . Abrasion   . Contusion of right elbow   . Cough   . Fall   . Altered mental status 06/18/2016  . Syncope and collapse 06/18/2016  . Dementia with behavioral disturbance 05/12/2016  . ONYCHOMYCOSIS 12/19/2006  . HYPERLIPIDEMIA 12/19/2006  . MENINGIOMA 10/04/2006  . HYPOTHYROIDISM 10/04/2006  . OSTEOPENIA 10/04/2006    Past Surgical History:  Procedure Laterality Date  . no surgerical history      OB History    No data available       Home Medications    Prior to Admission medications   Medication Sig Start Date End Date Taking? Authorizing Provider  acetaminophen (TYLENOL) 500 MG tablet Take 500 mg by mouth every 4 (four) hours as needed (for headache/minor discomfort/fever).   Yes [provider]  Acetaminophen 500 MG coapsule Take 500 mg by mouth 2 (two) times daily. (0800 & 2000)   Yes [provider]  alum & mag hydroxide-simeth (MINTOX) 497-026-37 MG/5ML suspension Take 30 mLs by mouth every 6 (six) hours as needed for indigestion or heartburn.   Yes [provider]  divalproex (DEPAKOTE) 125 MG DR tablet Take 125 mg by mouth 2 (two) times daily. (0800 & 2000) 10/29/16  Yes [provider]  donepezil (ARICEPT) 10 MG tablet Take 10 mg by  mouth daily. (2000) 05/29/16  Yes [provider]  ergocalciferol (VITAMIN D2) 50000 units capsule Take 50,000 Units by mouth every Friday. (0800)   Yes [provider]  guaifenesin (ROBAFEN) 100 MG/5ML syrup Take 200 mg by mouth every 6 (six) hours as needed for cough.   Yes [provider]  levothyroxine (SYNTHROID, LEVOTHROID) 25 MCG tablet Take 25 mcg by mouth daily before breakfast. (0600) 05/29/16  Yes [provider]  loperamide (IMODIUM) 2 MG capsule Take 2 mg by mouth as needed for diarrhea or loose stools.   Yes [provider]  LORazepam (ATIVAN) 0.5 MG tablet Take 0.5 mg by mouth 3 (three) times daily as needed. 09/18/16  Yes [provider]  magnesium hydroxide (MILK OF MAGNESIA) 400 MG/5ML suspension Take 30 mLs by mouth daily as needed (for constipation).   Yes [provider]  meclizine (ANTIVERT) 12.5 MG tablet Take 1 tablet (12.5 mg total) by mouth 2 (two) times daily as needed for dizziness. Patient taking differently: Take 12.5 mg by mouth 3 (three) times daily as needed for dizziness.  06/22/16  Yes [name], MD  Melatonin 3 MG TABS Take 3 mg by mouth at bedtime. (2000)   Yes [provider]  Neomycin-Bacitracin-Polymyxin (TRIPLE ANTIBIOTIC) 3.5-8304709916 OINT Apply 1 application topically 3 (three) times daily as needed (for skin tears/abrasions).   Yes [provider]  potassium chloride SA (K-DUR,KLOR-CON) 20 MEQ tablet Take 20 mEq by mouth daily. (0800) 11/17/16  Yes [provider]    Family History Family History  Problem Relation Age of Onset  . Cancer Mother   . Alzheimer's disease Neg Hx     Social History Social History  Substance Use Topics  . Smoking status: Never Smoker  . Smokeless tobacco: Never Used  . Alcohol use No     Allergies   Patient has no known allergies.   Review of Systems Review of Systems  Unable to perform ROS: Dementia     Physical  Exam Updated Vital Signs BP (!) 139/51 (BP Location: Right Arm)   Pulse 84   Temp 98.7 F (37.1 C) (Oral)   Resp 20   SpO2 94%   Physical Exam  Constitutional: She appears well-developed.  HENT:  Tenderness in the posterior occipital area.  Neck: Neck supple.  No midline tenderness with painless range of motion.  Cardiovascular: Normal rate.   Pulmonary/Chest: Effort normal.  Abdominal: Soft.  Musculoskeletal: Normal range of motion. She exhibits no tenderness.  No tenderness on extremities and apparent full range of motion. No spine tenderness.  Neurological: She is alert.  Awake and pleasant and will answer questions, but does have some dementia.  Skin: Skin is warm. Capillary refill takes less than 2 seconds.  Psychiatric: She has a normal mood and affect.     ED Treatments / Results  Labs (all labs ordered are listed, but only abnormal results are displayed) Labs Reviewed - No data to display  EKG  EKG Interpretation None       Radiology Dg Chest 2 View  Result Date: 11/24/2016 CLINICAL DATA:  Cough decreased breath sounds on the left EXAM: CHEST  2 VIEW COMPARISON:  06/19/2016 FINDINGS: Cardiac shadow is stable. Elevation of the left hemidiaphragm is again seen. The lungs are well aerated bilaterally. No focal infiltrate is seen. Degenerative changes of thoracic spine are noted. A compression deformities noted in the lower thoracic spine new from the prior exam but overall chronic appearing. IMPRESSION: No acute infiltrate seen. Compression deformity in the lower thoracic spine new from the prior exam but within overall chronic appearance. Clinical correlation is recommended. Electronically Signed   By: Inez Catalina M.D.   On: 11/24/2016 13:58   Ct Head Wo Contrast  Result Date: 11/25/2016 CLINICAL DATA:  Recent fall EXAM: CT HEAD WITHOUT CONTRAST TECHNIQUE: Contiguous axial images were obtained from the base of the skull through the vertex without intravenous  contrast. COMPARISON:  06/18/2016 FINDINGS: Brain: No findings to suggest acute hemorrhage, acute infarction or space-occupying mass lesion are noted. Small calcified meningioma is noted in the right posterior parietal region. Mild atrophic changes and chronic white matter ischemic changes are seen. Vascular: No hyperdense vessel or unexpected calcification. Skull: Normal. Negative for fracture or focal lesion. Sinuses/Orbits: There are changes of mucosal thickening within the ethmoid and maxillary sinuses. A small air-fluid level is noted in the left maxillary sinus. Other: None. IMPRESSION: Chronic atrophic and ischemic changes without acute abnormality. Electronically Signed   By: Inez Catalina M.D.   On: 11/25/2016 19:44   Ct Angio Chest Pe W/cm &/or Wo Cm  Result Date: 11/24/2016 CLINICAL DATA:  Cough, recent pneumonia. EXAM: CT ANGIOGRAPHY CHEST WITH CONTRAST TECHNIQUE: Multidetector CT imaging of the chest was performed using the standard protocol during bolus administration of intravenous contrast. Multiplanar CT image reconstructions and MIPs were obtained to evaluate  the vascular anatomy. CONTRAST:  100 cc Isovue 370 IV COMPARISON:  Chest x-ray earlier today. FINDINGS: Cardiovascular: No filling defects in the pulmonary arteries to suggest pulmonary emboli. Coronary artery calcifications diffusely throughout the left anterior descending coronary artery. Scattered aortic calcifications. No aneurysm. Mediastinum/Nodes: No mediastinal, hilar, or axillary adenopathy. Lungs/Pleura: Elevation of the left hemidiaphragm. Dependent and left basilar atelectasis. No effusions. Upper Abdomen: Imaging into the upper abdomen shows no acute findings. Musculoskeletal: Chest wall soft tissues are unremarkable. Mild compression fracture in the lower thoracic spine, likely T12, age indeterminate. Review of the MIP images confirms the above findings. IMPRESSION: No evidence of pulmonary embolus. Dependent and left basilar  atelectasis. Coronary artery disease, aortic atherosclerosis. Electronically Signed   By: Rolm Baptise M.D.   On: 11/24/2016 16:30    Procedures Procedures (including critical care time)  Medications Ordered in ED Medications - No data to display   Initial Impression / Assessment and Plan / ED Course  I have reviewed the triage vital signs and the nursing notes.  Pertinent labs & imaging results that were available during my care of the patient were reviewed by me and considered in my medical decision making (see chart for details).     Patient with fall. Baseline dementia. Tenderness to back of head. Negative CT. No other apparent injury. Discharge home.  Final Clinical Impressions(s) / ED Diagnoses   Final diagnoses:  Injury of head, initial encounter  Fall, initial encounter    New Prescriptions New Prescriptions   No medications on file     Davonna Belling, MD 11/25/16 2042

## 2016-11-25 NOTE — ED Triage Notes (Signed)
Per EMS. Pt from [facility] facility. Hx of dementia, oriented per norm. Staff had witnessed fall in dining room after dinner. Hx of dizziness and falls. No LOC. Pt hit head on back of chair during fall and has a hematoma to posterior head. Pt not on blood thinners. Denies any pain or injury. Sent here per facility protocol post-fall.

## 2016-12-05 ENCOUNTER — Emergency Department (HOSPITAL_COMMUNITY)
Admission: EM | Admit: 2016-12-05 | Discharge: 2016-12-05 | Disposition: A | Discharge status: Other Acute Inpatient Hospital | Payer: Medicare Other | Attending: Emergency Medicine | Admitting: Emergency Medicine

## 2016-12-05 ENCOUNTER — Encounter (HOSPITAL_COMMUNITY): Payer: Self-pay

## 2016-12-05 ENCOUNTER — Emergency Department (HOSPITAL_COMMUNITY): Payer: Medicare Other

## 2016-12-05 DIAGNOSIS — R05 Cough: Secondary | ICD-10-CM | POA: Diagnosis not present

## 2016-12-05 DIAGNOSIS — N390 Urinary tract infection, site not specified: Secondary | ICD-10-CM

## 2016-12-05 DIAGNOSIS — Z79899 Other long term (current) drug therapy: Secondary | ICD-10-CM | POA: Diagnosis not present

## 2016-12-05 DIAGNOSIS — R197 Diarrhea, unspecified: Secondary | ICD-10-CM | POA: Diagnosis not present

## 2016-12-05 DIAGNOSIS — E039 Hypothyroidism, unspecified: Secondary | ICD-10-CM | POA: Diagnosis not present

## 2016-12-05 DIAGNOSIS — I10 Essential (primary) hypertension: Secondary | ICD-10-CM | POA: Diagnosis not present

## 2016-12-05 DIAGNOSIS — E119 Type 2 diabetes mellitus without complications: Secondary | ICD-10-CM | POA: Insufficient documentation

## 2016-12-05 LAB — CBC WITH DIFFERENTIAL/PLATELET
BASOS ABS: 0 10*3/uL (ref 0.0–0.1)
BASOS PCT: 0 %
EOS PCT: 2 %
Eosinophils Absolute: 0.2 10*3/uL (ref 0.0–0.7)
HEMATOCRIT: 37.3 % (ref 36.0–46.0)
Hemoglobin: 12.1 g/dL (ref 12.0–15.0)
Lymphocytes Relative: 23 %
Lymphs Abs: 2.1 10*3/uL (ref 0.7–4.0)
MCH: 28.9 pg (ref 26.0–34.0)
MCHC: 32.4 g/dL (ref 30.0–36.0)
MCV: 89.2 fL (ref 78.0–100.0)
MONO ABS: 0.7 10*3/uL (ref 0.1–1.0)
Monocytes Relative: 8 %
NEUTROS ABS: 6 10*3/uL (ref 1.7–7.7)
Neutrophils Relative %: 67 %
PLATELETS: 318 10*3/uL (ref 150–400)
RBC: 4.18 MIL/uL (ref 3.87–5.11)
RDW: 14.4 % (ref 11.5–15.5)
WBC: 9 10*3/uL (ref 4.0–10.5)

## 2016-12-05 LAB — URINALYSIS, ROUTINE W REFLEX MICROSCOPIC
BILIRUBIN URINE: NEGATIVE
Glucose, UA: NEGATIVE mg/dL
KETONES UR: NEGATIVE mg/dL
Nitrite: POSITIVE — AB
PH: 6 (ref 5.0–8.0)
PROTEIN: NEGATIVE mg/dL
Specific Gravity, Urine: 1.014 (ref 1.005–1.030)

## 2016-12-05 LAB — COMPREHENSIVE METABOLIC PANEL
ALBUMIN: 3.9 g/dL (ref 3.5–5.0)
ALT: 16 U/L (ref 14–54)
AST: 21 U/L (ref 15–41)
Alkaline Phosphatase: 54 U/L (ref 38–126)
Anion gap: 9 (ref 5–15)
BUN: 18 mg/dL (ref 6–20)
CHLORIDE: 106 mmol/L (ref 101–111)
CO2: 28 mmol/L (ref 22–32)
CREATININE: 0.75 mg/dL (ref 0.44–1.00)
Calcium: 9.3 mg/dL (ref 8.9–10.3)
GFR calc Af Amer: >60 mL/min (ref >60)
GLUCOSE: 100 mg/dL — AB (ref 65–99)
POTASSIUM: 4.1 mmol/L (ref 3.5–5.1)
Sodium: 143 mmol/L (ref 135–145)
Total Bilirubin: 0.5 mg/dL (ref 0.3–1.2)
Total Protein: 8.1 g/dL (ref 6.5–8.1)

## 2016-12-05 LAB — LIPASE, BLOOD: Lipase: 26 U/L (ref 11–51)

## 2016-12-05 MED ORDER — CEPHALEXIN 500 MG PO CAPS: 500.0000 mg | ORAL_CAPSULE | Freq: Two times a day (BID) | ORAL | 0 refills | Status: DC

## 2016-12-05 NOTE — ED Triage Notes (Signed)
Nurses at [facility] have noted pt. To have a few diarrhea stools per day for past couple of days. They also report pt. Has just finished an antibiotic given for uri sx. Pt. Arrives here in no distress.

## 2016-12-05 NOTE — ED Provider Notes (Signed)
Medical screening examination/treatment/procedure(s) were conducted as a shared visit with non-physician practitioner(s) and myself.  I personally evaluated the patient during the encounter.   EKG Interpretation None     83 year old female presents with diarrhea for nursing home. She has no diarrhea here. Has evidence of UTI will be placed on medications for that. She is hemodynamically stable.   Lacretia Leigh, MD 12/05/16 1755

## 2016-12-05 NOTE — Discharge Instructions (Signed)
Take your antibiotics as prescribed until finished for your UTI.  I recommend follow-up with your primary care provider within the next 3-4 days if your symptoms have not improved. Please return to the Emergency Department if symptoms worsen or new onset of fever, confusion, altered mental status, chest pain, difficulty breathing, vomiting, abdominal pain, vomiting, worsening diarrhea, blood in urine or stool, weakness, syncope.

## 2016-12-05 NOTE — ED Provider Notes (Signed)
[facility] DEPT Provider Note   CSN: [id number] Arrival date & time: 12/05/16  1422     History   Chief Complaint Chief Complaint  Patient presents with  . Diarrhea    HPI Yer Castello is a 83 y.o. female.  HPI   Patient is a 83 year old female with history of diabetes, hypertension and hyperlipidemia who presents to the ED via EMS from [facility] with complaint of diarrhea. EMS reports nursing staff at facility state patient has been having a few episodes of diarrhea daily for the past few days. Nursing staff also reports patient recently finished antibiotic that was given for URI symptoms. Upon arrival to the ED patient denies any pain or complaints. Denies fever, chest pain, shortness of breath, cough, abdominal pain, nausea, vomiting and diarrhea, urinary symptoms, weakness.   I spoke with nurse at nursing facility who reports that patient has had multiple episodes of nonbloody diarrhea daily for the past week. Nurse denies any recent antibiotic use or recent hospitalizations. Nurse denies any other residents having C. difficile recently, denies any known exposures. Denies use of laxatives. Nurse reports patient has appeared slightly more "lethargic" over the past few days but reports she has otherwise been at her baseline mental status without any other symptoms or complaints.   Past Medical History:  Diagnosis Date  . Anxiety   . Depression   . Diabetes mellitus without complication ([place])   . Hyperlipemia   . Hypertension     Patient Active Problem List   Diagnosis Date Noted  . Abrasion   . Contusion of right elbow   . Cough   . Fall   . Altered mental status 06/18/2016  . Syncope and collapse 06/18/2016  . Dementia with behavioral disturbance 05/12/2016  . ONYCHOMYCOSIS 12/19/2006  . HYPERLIPIDEMIA 12/19/2006  . MENINGIOMA 10/04/2006  . HYPOTHYROIDISM 10/04/2006  . OSTEOPENIA 10/04/2006    Past Surgical History:  Procedure Laterality Date  . no  surgerical history      OB History    No data available       Home Medications    Prior to Admission medications   Medication Sig Start Date End Date Taking? Authorizing Provider  acetaminophen (TYLENOL) 500 MG tablet Take 500 mg by mouth 2 (two) times daily.    Yes [provider]  acetaminophen (TYLENOL) 500 MG tablet Take 500 mg by mouth every 4 (four) hours as needed for mild pain, moderate pain, fever or headache.   Yes [provider]  alum & mag hydroxide-simeth (MINTOX) 200-200-20 MG/5ML suspension Take 30 mLs by mouth as needed for indigestion or heartburn.   Yes [provider]  divalproex (DEPAKOTE) 125 MG DR tablet Take 125 mg by mouth 2 (two) times daily.  10/29/16  Yes [provider]  donepezil (ARICEPT) 10 MG tablet Take 10 mg by mouth at bedtime.  05/29/16  Yes [provider]  ergocalciferol (VITAMIN D2) 50000 units capsule Take 50,000 Units by mouth every Friday.    Yes [provider]  guaifenesin (ROBAFEN) 100 MG/5ML syrup Take 200 mg by mouth every 6 (six) hours as needed for cough or congestion.    Yes [provider]  levothyroxine (SYNTHROID, LEVOTHROID) 25 MCG tablet Take 25 mcg by mouth daily before breakfast.  05/29/16  Yes [provider]  loperamide (IMODIUM) 2 MG capsule Take 2 mg by mouth as needed for diarrhea or loose stools.   Yes [provider]  LORazepam (ATIVAN) 0.5 MG tablet Take  0.5 mg by mouth every 8 (eight) hours as needed (for agitation).  09/18/16  Yes [provider]  magnesium hydroxide (MILK OF MAGNESIA) 400 MG/5ML suspension Take 30 mLs by mouth at bedtime as needed (for constipation).    Yes [provider]  meclizine (ANTIVERT) 12.5 MG tablet Take 1 tablet (12.5 mg total) by mouth 2 (two) times daily as needed for dizziness. Patient taking differently: Take 12.5 mg by mouth every 8 (eight) hours as needed for dizziness.  06/22/16  Yes [name], MD  Melatonin 3 MG TABS Take 3 mg by mouth at bedtime.    Yes [provider]  Neomycin-Bacitracin-Polymyxin (TRIPLE ANTIBIOTIC) [phone] OINT Apply 1 application topically as needed (for skin tears/abrasions).    Yes [provider]  potassium chloride SA (K-DUR,KLOR-CON) 20 MEQ tablet Take 20 mEq by mouth daily with breakfast.  11/17/16  Yes [provider]  cephALEXin (KEFLEX) 500 MG capsule Take 1 capsule (500 mg total) by mouth 2 (two) times daily. 12/05/16   Nona Dell, PA-C    Family History Family History  Problem Relation Age of Onset  . Cancer Mother   . Alzheimer's disease Neg Hx     Social History Social History  Substance Use Topics  . Smoking status: Never Smoker  . Smokeless tobacco: Never Used  . Alcohol use No     Allergies   Patient has no known allergies.   Review of Systems Review of Systems  Respiratory: Positive for cough.   Gastrointestinal: Positive for diarrhea.  All other systems reviewed and are negative.    Physical Exam Updated Vital Signs BP (!) 128/56 (BP Location: Left Arm)   Pulse 69   Temp 97.8 F (36.6 C) (Oral)   Resp 16   SpO2 94%   Physical Exam  Constitutional: She appears well-developed and well-nourished.  Elderly appearing female. Pt intermittently coughing throughout exam.  HENT:  Head: Normocephalic and atraumatic.  Mouth/Throat: Uvula is midline, oropharynx is clear and moist and mucous membranes are normal. No oropharyngeal exudate, posterior oropharyngeal edema, posterior oropharyngeal erythema or tonsillar abscesses. No tonsillar exudate.  Eyes: Conjunctivae and EOM are normal. Right eye exhibits no discharge. Left eye exhibits no discharge. No scleral icterus.  Neck: Normal range of motion. Neck supple.  Cardiovascular: Normal rate, regular rhythm, normal heart sounds and intact distal pulses.   Pulmonary/Chest: Effort normal. No respiratory distress. She has no  wheezes. She has rales in the left lower field. She exhibits no tenderness.  Abdominal: Soft. Bowel sounds are normal. She exhibits no distension and no mass. There is no tenderness. There is no rebound and no guarding.  Musculoskeletal: She exhibits no edema.  Neurological: She is alert.  Pt alert and oriented to person and place only.  Skin: Skin is warm and dry.  Nursing note and vitals reviewed.    ED Treatments / Results  Labs (all labs ordered are listed, but only abnormal results are displayed) Labs Reviewed  COMPREHENSIVE METABOLIC PANEL - Abnormal; Notable for the following:       Result Value   Glucose, Bld 100 (*)    All other components within normal limits  URINALYSIS, ROUTINE W REFLEX MICROSCOPIC - Abnormal; Notable for the following:    APPearance CLOUDY (*)    Hgb urine dipstick SMALL (*)    Nitrite POSITIVE (*)    Leukocytes, UA LARGE (*)    Bacteria, UA MANY (*)    Squamous Epithelial / LPF 0-5 (*)  All other components within normal limits  URINE CULTURE  CBC WITH DIFFERENTIAL/PLATELET  LIPASE, BLOOD    EKG  EKG Interpretation None       Radiology Dg Chest 2 View  Result Date: 12/05/2016 CLINICAL DATA:  Diarrhea EXAM: CHEST  2 VIEW COMPARISON:  Chest x-rays dated 11/24/2016 and 06/19/2016. FINDINGS: Stable elevation of the left hemidiaphragm. Lungs are clear. No pleural effusion or pneumothorax. Heart size and mediastinal contours stable. Atherosclerotic changes again noted at the aortic arch. No acute appearing osseous abnormality. Compression fracture deformity of the T12 vertebral body is stable compared to plain film and CT of 11/24/2016, new compared to the earlier plain film of 06/19/2016. IMPRESSION: No active cardiopulmonary disease. No evidence of pneumonia or pulmonary edema. Electronically Signed   By: Franki Cabot M.D.   On: 12/05/2016 16:11    Procedures Procedures (including critical care time)  Medications Ordered in ED Medications  - No data to display   Initial Impression / Assessment and Plan / ED Course  I have reviewed the triage vital signs and the nursing notes.  Pertinent labs & imaging results that were available during my care of the patient were reviewed by me and considered in my medical decision making (see chart for details).     Patient was sent from nursing facility with reported diarrhea for the past week. Denies fever, recent antibiotic use, recent hospitalizations or recent diagnosis of C. difficile. Patient denies any pain or complaints at this time. Nursing facility reports patient is at baseline mental status, alert and oriented to person and place only. VSS. Exam unremarkable, abdomen soft and nontender. Patient intermittently coughing throughout exam and noted to have mild Rales present in left lower lobe. Remaining exam unremarkable. Labs unremarkable. Chest x-ray negative. UA consistent with UTI. Patient without any episodes of diarrhea while in the ED. I do not suspect C. difficile infection at this time as patient has not had any episodes on the ED and does not have any risk factors. Discussed patient with Dr. Zenia Resides who also evaluated patient in the ED. Plan to discharge patient back to nursing facility with prescription of Keflex for UTI and symptomatically treatment. Advised to follow up with PCP. Discussed return precautions.  Final Clinical Impressions(s) / ED Diagnoses   Final diagnoses:  Diarrhea, unspecified type  Urinary tract infection without hematuria, site unspecified    New Prescriptions New Prescriptions   CEPHALEXIN (KEFLEX) 500 MG CAPSULE    Take 1 capsule (500 mg total) by mouth 2 (two) times daily.     Nona Dell, [place] 12/05/16 [phone]

## 2016-12-05 NOTE — ED Notes (Signed)
Bed: WHALD Expected date:  Expected time:  Means of arrival:  Comments: 
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
## 2016-12-05 NOTE — ED Notes (Signed)
I have notified [facility] for tx back to [facility].

## 2016-12-05 NOTE — ED Notes (Signed)
Bed: SE39 Expected date:  Expected time:  Means of arrival:  Comments: EMS-diarhhea

## 2016-12-08 LAB — URINE CULTURE

## 2016-12-09 ENCOUNTER — Telehealth: Payer: Self-pay

## 2016-12-09 NOTE — Telephone Encounter (Signed)
Post ED Visit - Positive Culture Follow-up  Culture report reviewed by antimicrobial stewardship pharmacist:  []  Elenor Quinones, Pharm.D. []  Heide Guile, Pharm.D., BCPS AQ-ID [x]  Parks Neptune, Pharm.D., BCPS []  Alycia Rossetti, Pharm.D., BCPS []  [place], Pharm.D., BCPS, AAHIVP []  Legrand Como, Pharm.D., BCPS, AAHIVP []  Salome Arnt, PharmD, BCPS []  Dimitri Ped, PharmD, BCPS []  Vincenza Hews, PharmD, BCPS  Positive urine culture Treated with Cephalexin, organism sensitive to the same and no further patient follow-up is required at this time.  Genia Del 12/09/2016, 11:32 AM

## 2017-03-28 ENCOUNTER — Encounter (HOSPITAL_COMMUNITY): Payer: Self-pay | Admitting: Emergency Medicine

## 2017-03-28 ENCOUNTER — Emergency Department (HOSPITAL_COMMUNITY)
Admission: EM | Admit: 2017-03-28 | Discharge: 2017-03-28 | Disposition: A | Discharge status: Home / Self Care | Payer: Medicare Other | Attending: Emergency Medicine | Admitting: Emergency Medicine

## 2017-03-28 ENCOUNTER — Emergency Department (HOSPITAL_COMMUNITY): Payer: Medicare Other

## 2017-03-28 DIAGNOSIS — Y9383 Activity, rough housing and horseplay: Secondary | ICD-10-CM | POA: Diagnosis not present

## 2017-03-28 DIAGNOSIS — E119 Type 2 diabetes mellitus without complications: Secondary | ICD-10-CM | POA: Insufficient documentation

## 2017-03-28 DIAGNOSIS — Z79899 Other long term (current) drug therapy: Secondary | ICD-10-CM | POA: Insufficient documentation

## 2017-03-28 DIAGNOSIS — F0390 Unspecified dementia without behavioral disturbance: Secondary | ICD-10-CM | POA: Diagnosis not present

## 2017-03-28 DIAGNOSIS — S42201A Unspecified fracture of upper end of right humerus, initial encounter for closed fracture: Secondary | ICD-10-CM | POA: Diagnosis not present

## 2017-03-28 DIAGNOSIS — X509XXA Other and unspecified overexertion or strenuous movements or postures, initial encounter: Secondary | ICD-10-CM | POA: Diagnosis not present

## 2017-03-28 DIAGNOSIS — Y92129 Unspecified place in nursing home as the place of occurrence of the external cause: Secondary | ICD-10-CM | POA: Diagnosis not present

## 2017-03-28 DIAGNOSIS — E039 Hypothyroidism, unspecified: Secondary | ICD-10-CM | POA: Diagnosis not present

## 2017-03-28 DIAGNOSIS — I10 Essential (primary) hypertension: Secondary | ICD-10-CM | POA: Diagnosis not present

## 2017-03-28 DIAGNOSIS — S4991XA Unspecified injury of right shoulder and upper arm, initial encounter: Secondary | ICD-10-CM | POA: Diagnosis present

## 2017-03-28 DIAGNOSIS — Y999 Unspecified external cause status: Secondary | ICD-10-CM | POA: Diagnosis not present

## 2017-03-28 DIAGNOSIS — W19XXXA Unspecified fall, initial encounter: Secondary | ICD-10-CM

## 2017-03-28 HISTORY — DX: Unspecified dementia, unspecified severity, without behavioral disturbance, psychotic disturbance, mood disturbance, and anxiety: F03.90

## 2017-03-28 MED ORDER — TRAMADOL HCL 50 MG PO TABS: 50.0000 mg | ORAL_TABLET | Freq: Four times a day (QID) | ORAL | 0 refills | Status: DC | PRN

## 2017-03-28 MED ORDER — FENTANYL CITRATE (PF) 100 MCG/2ML IJ SOLN
50.0000 ug | Freq: Once | INTRAMUSCULAR | Status: DC
Start: 2017-03-28 — End: 2017-03-28
  Filled 2017-03-28: qty 2

## 2017-03-28 NOTE — ED Notes (Signed)
Two unsuccessful IV attempts.
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
## 2017-03-28 NOTE — Discharge Instructions (Signed)
Patient needs to follow up with an orthopedist regarding the shoulder fracture.  She should sleep in a more upright position for comfort, such as a reclining chair.
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
## 2017-03-29 ENCOUNTER — Ambulatory Visit: Payer: Self-pay | Admitting: Orthopedic Surgery

## 2017-10-05 ENCOUNTER — Encounter (HOSPITAL_COMMUNITY): Payer: Self-pay | Admitting: Emergency Medicine

## 2017-10-05 ENCOUNTER — Emergency Department (HOSPITAL_COMMUNITY)
Admission: EM | Admit: 2017-10-05 | Discharge: 2017-10-05 | Disposition: A | Discharge status: Skilled Nursing Facility | Payer: Medicare Other | Attending: Emergency Medicine | Admitting: Emergency Medicine

## 2017-10-05 ENCOUNTER — Other Ambulatory Visit: Payer: Self-pay

## 2017-10-05 DIAGNOSIS — W06XXXA Fall from bed, initial encounter: Secondary | ICD-10-CM | POA: Insufficient documentation

## 2017-10-05 DIAGNOSIS — I10 Essential (primary) hypertension: Secondary | ICD-10-CM | POA: Insufficient documentation

## 2017-10-05 DIAGNOSIS — Y939 Activity, unspecified: Secondary | ICD-10-CM | POA: Diagnosis not present

## 2017-10-05 DIAGNOSIS — F0390 Unspecified dementia without behavioral disturbance: Secondary | ICD-10-CM | POA: Diagnosis not present

## 2017-10-05 DIAGNOSIS — Y999 Unspecified external cause status: Secondary | ICD-10-CM | POA: Insufficient documentation

## 2017-10-05 DIAGNOSIS — Z79899 Other long term (current) drug therapy: Secondary | ICD-10-CM | POA: Insufficient documentation

## 2017-10-05 DIAGNOSIS — E119 Type 2 diabetes mellitus without complications: Secondary | ICD-10-CM | POA: Diagnosis not present

## 2017-10-05 DIAGNOSIS — Z043 Encounter for examination and observation following other accident: Secondary | ICD-10-CM | POA: Diagnosis present

## 2017-10-05 DIAGNOSIS — S0083XA Contusion of other part of head, initial encounter: Secondary | ICD-10-CM | POA: Insufficient documentation

## 2017-10-05 DIAGNOSIS — Y92122 Bedroom in nursing home as the place of occurrence of the external cause: Secondary | ICD-10-CM | POA: Diagnosis not present

## 2017-10-05 LAB — URINALYSIS, ROUTINE W REFLEX MICROSCOPIC
Bilirubin Urine: NEGATIVE
Glucose, UA: NEGATIVE mg/dL
KETONES UR: NEGATIVE mg/dL
Nitrite: NEGATIVE
PROTEIN: NEGATIVE mg/dL
Specific Gravity, Urine: 1.003 — ABNORMAL LOW (ref 1.005–1.030)
pH: 6 (ref 5.0–8.0)

## 2017-10-05 NOTE — ED Notes (Signed)
Report to Liz at [facility] 

## 2017-10-05 NOTE — ED Provider Notes (Signed)
[facility] DEPT Provider Note: Georgena Spurling, MD, FACEP  CSN: [id number] MRN: [id number] ARRIVAL: 10/05/17 at [place]: [place]  Fall  Level 5 caveat: Dementia HISTORY OF PRESENT ILLNESS  10/05/17 3:56 AM Emily Heath is a 84 y.o. female with history of dementia.  She usually sleeps in a recliner but was placed in bed by her living facility staff.  They found her on the floor having apparently rolled out of bed just prior to arrival.  She has ecchymosis to her left forehead but no other obvious injuries.  She is at her baseline mental status.  She complains only of pain at the site of the ecchymosis.  She is not on any anticoagulation.   Past Medical History:  Diagnosis Date  . Anxiety   . Dementia   . Depression   . Diabetes mellitus without complication ([place])   . Hyperlipemia   . Hypertension     Past Surgical History:  Procedure Laterality Date  . no surgerical history      Family History  Problem Relation Age of Onset  . Cancer Mother   . Alzheimer's disease Neg Hx     Social History   Tobacco Use  . Smoking status: Never Smoker  . Smokeless tobacco: Never Used  Substance Use Topics  . Alcohol use: No  . Drug use: No    Prior to Admission medications   Medication Sig Start Date End Date Taking? Authorizing Provider  acetaminophen (TYLENOL) 500 MG tablet Take 500 mg by mouth 2 (two) times daily.    Yes [provider]  acetaminophen (TYLENOL) 500 MG tablet Take 500 mg by mouth every 4 (four) hours as needed for mild pain, moderate pain, fever or headache.   Yes [provider]  alum & mag hydroxide-simeth (MINTOX) 200-200-20 MG/5ML suspension Take 30 mLs by mouth as needed for indigestion or heartburn.   Yes [provider]  divalproex (DEPAKOTE) 125 MG DR tablet Take 125 mg by mouth 3 (three) times daily.  10/29/16  Yes [provider]  donepezil (ARICEPT) 10 MG tablet Take 10 mg by mouth at bedtime.   05/29/16  Yes [provider]  ergocalciferol (VITAMIN D2) 50000 units capsule Take 50,000 Units by mouth every Friday.    Yes [provider]  guaifenesin (ROBAFEN) 100 MG/5ML syrup Take 200 mg by mouth every 6 (six) hours as needed for cough or congestion.    Yes [provider]  levothyroxine (SYNTHROID, LEVOTHROID) 25 MCG tablet Take 25 mcg by mouth daily before breakfast.  05/29/16  Yes [provider]  loperamide (IMODIUM) 2 MG capsule Take 2 mg by mouth as needed for diarrhea or loose stools.   Yes [provider]  magnesium hydroxide (MILK OF MAGNESIA) 400 MG/5ML suspension Take 30 mLs by mouth at bedtime as needed (for constipation).    Yes [provider]  Melatonin 5 MG TABS Take 5 mg by mouth at bedtime.    Yes [provider]  Neomycin-Bacitracin-Polymyxin (TRIPLE ANTIBIOTIC) [phone] OINT Apply 1 application topically as needed (for skin tears/abrasions).    Yes [provider]  OVER THE COUNTER MEDICATION Take 2 Wafers by mouth daily. Metamucil apple crisp wafer   Yes [provider]  potassium chloride SA (K-DUR,KLOR-CON) 20 MEQ tablet Take 20 mEq by mouth daily with breakfast.  11/17/16  Yes [provider]  meclizine (ANTIVERT) 12.5 MG tablet Take 1 tablet (12.5 mg total) by mouth 2 (two)  times daily as needed for dizziness. Patient not taking: Reported on 03/28/2017 06/22/16   [name], MD    Allergies Patient has no known allergies.   REVIEW OF SYSTEMS     PHYSICAL EXAMINATION  Initial Vital Signs Blood pressure 140/61, pulse 69, temperature (!) 97.5 F (36.4 C), temperature source Oral, resp. rate 18, SpO2 96 %.  Examination General: Well-developed, well-nourished female in no acute distress; appearance consistent with age of record HENT: normocephalic; ecchymosis left forehead without bony deformity Eyes: pupils equal, round and reactive to light; extraocular muscles  intact Neck: supple Heart: regular rate and rhythm Lungs: clear to auscultation bilaterally Abdomen: soft; nondistended; nontender; bowel sounds present Extremities: No deformity; no tenderness or pain on passive range of motion; pulses normal; 2+ pitting edema of lower legs and feet Neurologic: Awake, alert; noted to move all extremities; no facial droop Skin: Warm and dry Psychiatric: Normal mood and affect   RESULTS  Summary of this visit's results, reviewed by myself:   EKG Interpretation  Date/Time:    Ventricular Rate:    PR Interval:    QRS Duration:   QT Interval:    QTC Calculation:   R Axis:     Text Interpretation:        Laboratory Studies: No results found for this or any previous visit (from the past 24 hour(s)). Imaging Studies: No results found.  ED COURSE  Nursing notes and initial vitals signs, including pulse oximetry, reviewed.  Vitals:   10/05/17 0312  BP: 140/61  Pulse: 69  Resp: 18  Temp: (!) 97.5 F (36.4 C)  TempSrc: Oral  SpO2: 96%    PROCEDURES    ED DIAGNOSES     ICD-10-CM   1. Fall from bed, initial encounter W06.XXXA   2. Contusion of face, initial encounter S00.83XA        Randie Bloodgood, Jenny Reichmann, MD 10/05/17 [phone]

## 2017-10-05 NOTE — ED Notes (Signed)
[facility] notified of need for transport. 

## 2017-10-05 NOTE — ED Notes (Signed)
Bed: LT07 Expected date:  Expected time:  Means of arrival:  Comments: Elderly Fall
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
## 2017-10-07 LAB — URINE CULTURE

## 2017-10-08 ENCOUNTER — Telehealth: Payer: Self-pay | Admitting: *Deleted

## 2017-10-08 NOTE — Telephone Encounter (Signed)
Post ED Visit - Positive Culture Follow-up  Culture report reviewed by antimicrobial stewardship pharmacist:  []  Elenor Quinones, Pharm.D. []  Heide Guile, Pharm.D., BCPS AQ-ID []  Parks Neptune, Pharm.D., BCPS []  Alycia Rossetti, Pharm.D., BCPS []  [place], [place].D., BCPS, AAHIVP [x]  Legrand Como, Pharm.D., BCPS, AAHIVP []  Salome Arnt, PharmD, BCPS []  Jalene Mullet, PharmD []  Vincenza Hews, PharmD, BCPS  Positive urine culture, reviewed by Delia Heady, PA-C and no further patient follow-up is required at this time.  Harlon Flor [facility] 10/08/2017, 9:40 AM

## 2017-10-08 NOTE — Progress Notes (Signed)
ED Antimicrobial Stewardship Positive Culture Follow Up   Emily Heath is an 84 y.o. female who presented to [facility] on 10/05/2017 with a chief complaint of  Chief Complaint  Patient presents with  . Fall    Recent Results (from the past 59 hour(s))  Urine culture     Status: Abnormal   Collection Time: 10/05/17  4:52 AM  Result Value Ref Range Status   Specimen Description   Final    URINE, RANDOM Performed at [facility] [address] [place], [place] [address]    Special Requests   Final    NONE Performed at [facility], [place] [address], [place], [place] [address]    Culture >=100,000 COLONIES/mL ESCHERICHIA COLI (A)  Final   Report Status 10/07/2017 FINAL  Final   Organism ID, Bacteria ESCHERICHIA COLI (A)  Final      Susceptibility   Escherichia coli - MIC*    AMPICILLIN <=2 SENSITIVE Sensitive     CEFAZOLIN <=4 SENSITIVE Sensitive     CEFTRIAXONE <=1 SENSITIVE Sensitive     CIPROFLOXACIN >=4 RESISTANT Resistant     GENTAMICIN <=1 SENSITIVE Sensitive     IMIPENEM <=0.25 SENSITIVE Sensitive     NITROFURANTOIN <=16 SENSITIVE Sensitive     TRIMETH/SULFA <=20 SENSITIVE Sensitive     AMPICILLIN/SULBACTAM <=2 SENSITIVE Sensitive     PIP/TAZO <=4 SENSITIVE Sensitive     Extended ESBL NEGATIVE Sensitive     * >=100,000 COLONIES/mL ESCHERICHIA COLI   Pt was at baseline mental status in ED. No complaints of dysuria, hematuria, or flank pain.  No antibiotic therapy recommended at this time.    ED Provider: Delia Heady PA-C   Karmen Stabs, PharmD Candidate 10/08/2017, 9:01 AM Phone# [phone]

## 2017-11-17 IMAGING — CR DG PELVIS 1-2V
1 series · 1 of 1 positions shown · non-contrast
Comparison: None.

CLINICAL DATA: Status post fall.  Right-sided pain.

EXAM:
PELVIS - 1-2 VIEW

[x pelvis]
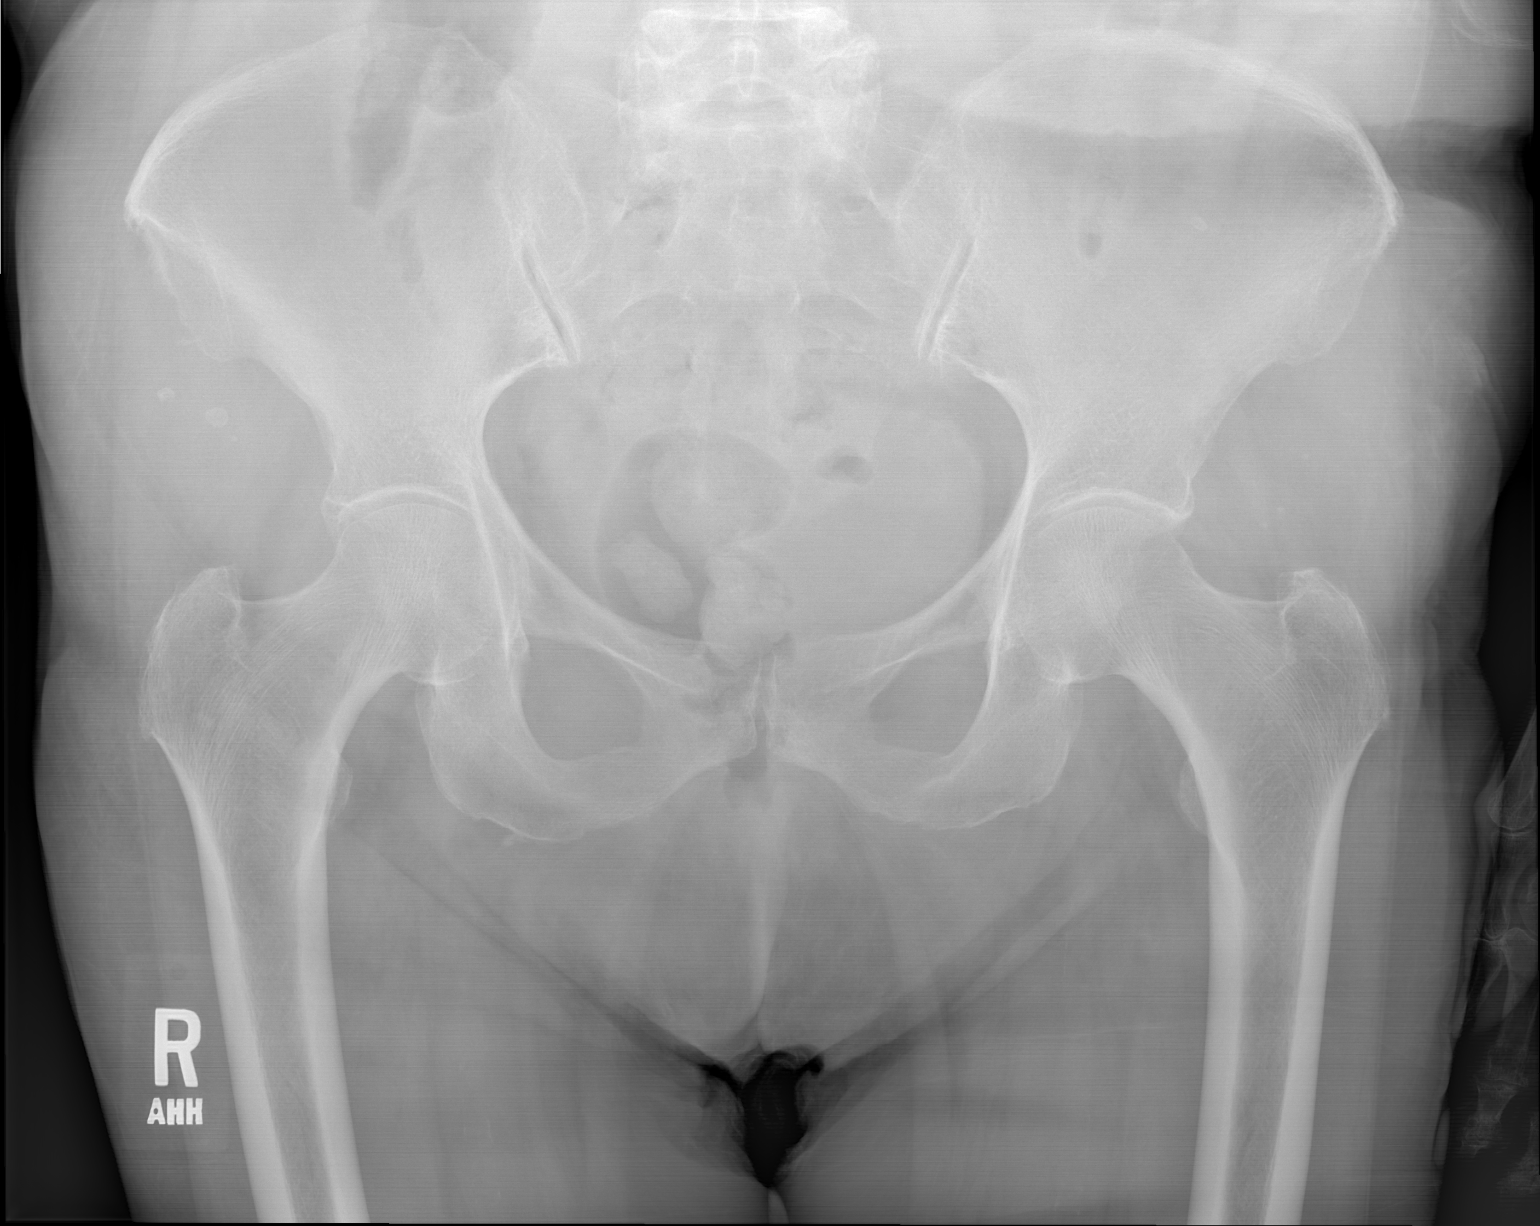

[1 of 1 positions shown; findings below may reference images not displayed]

FINDINGS: There is no evidence of pelvic fracture or diastasis. No pelvic bone
lesions are seen.
IMPRESSION: Negative.

## 2017-12-29 ENCOUNTER — Other Ambulatory Visit: Payer: Self-pay

## 2017-12-29 ENCOUNTER — Encounter (HOSPITAL_COMMUNITY): Payer: Self-pay

## 2017-12-29 ENCOUNTER — Emergency Department (HOSPITAL_COMMUNITY)
Admission: EM | Admit: 2017-12-29 | Discharge: 2017-12-29 | Disposition: A | Discharge status: Home / Self Care | Payer: Medicare Other | Attending: Emergency Medicine | Admitting: Emergency Medicine

## 2017-12-29 DIAGNOSIS — Y999 Unspecified external cause status: Secondary | ICD-10-CM | POA: Insufficient documentation

## 2017-12-29 DIAGNOSIS — S0990XA Unspecified injury of head, initial encounter: Secondary | ICD-10-CM | POA: Diagnosis not present

## 2017-12-29 DIAGNOSIS — W050XXA Fall from non-moving wheelchair, initial encounter: Secondary | ICD-10-CM | POA: Insufficient documentation

## 2017-12-29 DIAGNOSIS — Z79899 Other long term (current) drug therapy: Secondary | ICD-10-CM | POA: Diagnosis not present

## 2017-12-29 DIAGNOSIS — F0391 Unspecified dementia with behavioral disturbance: Secondary | ICD-10-CM | POA: Diagnosis not present

## 2017-12-29 DIAGNOSIS — Y939 Activity, unspecified: Secondary | ICD-10-CM | POA: Insufficient documentation

## 2017-12-29 DIAGNOSIS — Y92129 Unspecified place in nursing home as the place of occurrence of the external cause: Secondary | ICD-10-CM | POA: Insufficient documentation

## 2017-12-29 DIAGNOSIS — E119 Type 2 diabetes mellitus without complications: Secondary | ICD-10-CM | POA: Diagnosis not present

## 2017-12-29 DIAGNOSIS — W19XXXA Unspecified fall, initial encounter: Secondary | ICD-10-CM

## 2017-12-29 DIAGNOSIS — E039 Hypothyroidism, unspecified: Secondary | ICD-10-CM | POA: Insufficient documentation

## 2017-12-29 DIAGNOSIS — I10 Essential (primary) hypertension: Secondary | ICD-10-CM | POA: Diagnosis not present

## 2017-12-29 NOTE — ED Notes (Signed)
Bed: WA07 Expected date:  Expected time:  Means of arrival:  Comments: 84 yo fall

## 2017-12-29 NOTE — ED Triage Notes (Signed)
Patient coming from [facility] with c/o fall from a wheelchair. Patient try to get out of the wheelchair and hit her head. Patient is not on blood thinners. Patient have hx dementia.

## 2017-12-29 NOTE — ED Notes (Signed)
[facility] call and made aware of the patient return to the facility by [facility].

## 2017-12-29 NOTE — ED Notes (Signed)
Patient waiting for [facility] to transport.
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
## 2017-12-29 NOTE — ED Provider Notes (Signed)
[facility] DEPT Provider Note   CSN: [id number] Arrival date & time: 12/29/17  1212     History   Chief Complaint No chief complaint on file.  Level 5 caveat: Dementia  HPI Emily Heath is a 84 y.o. female.  HPI 84 year old female with a history of dementia presents the emergency department with a fall forward out of her wheelchair.  It suspected that she hit her head.  She is not on chronic anticoagulation.  Baseline mental status.  Patient without complaints at this time.  Brought to the ER per protocol for evaluation.   Past Medical History:  Diagnosis Date  . Anxiety   . Dementia   . Depression   . Diabetes mellitus without complication ([place])   . Hyperlipemia   . Hypertension     Patient Active Problem List   Diagnosis Date Noted  . Abrasion   . Contusion of right elbow   . Cough   . Fall   . Altered mental status 06/18/2016  . Syncope and collapse 06/18/2016  . Dementia with behavioral disturbance 05/12/2016  . ONYCHOMYCOSIS 12/19/2006  . HYPERLIPIDEMIA 12/19/2006  . MENINGIOMA 10/04/2006  . HYPOTHYROIDISM 10/04/2006  . OSTEOPENIA 10/04/2006    Past Surgical History:  Procedure Laterality Date  . no surgerical history       OB History   None      Home Medications    Prior to Admission medications   Medication Sig Start Date End Date Taking? Authorizing Provider  acetaminophen (TYLENOL) 500 MG tablet Take 500 mg by mouth 2 (two) times daily.     [provider]  acetaminophen (TYLENOL) 500 MG tablet Take 500 mg by mouth every 4 (four) hours as needed for mild pain, moderate pain, fever or headache.    [provider]  alum & mag hydroxide-simeth (MINTOX) 200-200-20 MG/5ML suspension Take 30 mLs by mouth as needed for indigestion or heartburn.    [provider]  divalproex (DEPAKOTE) 125 MG DR tablet Take 125 mg by mouth 3 (three) times daily.  10/29/16   [provider]  donepezil  (ARICEPT) 10 MG tablet Take 10 mg by mouth at bedtime.  05/29/16   [provider]  ergocalciferol (VITAMIN D2) 50000 units capsule Take 50,000 Units by mouth every Friday.     [provider]  guaifenesin (ROBAFEN) 100 MG/5ML syrup Take 200 mg by mouth every 6 (six) hours as needed for cough or congestion.     [provider]  levothyroxine (SYNTHROID, LEVOTHROID) 25 MCG tablet Take 25 mcg by mouth daily before breakfast.  05/29/16   [provider]  loperamide (IMODIUM) 2 MG capsule Take 2 mg by mouth as needed for diarrhea or loose stools.    [provider]  magnesium hydroxide (MILK OF MAGNESIA) 400 MG/5ML suspension Take 30 mLs by mouth at bedtime as needed (for constipation).     [provider]  Melatonin 5 MG TABS Take 5 mg by mouth at bedtime.     [provider]  Neomycin-Bacitracin-Polymyxin (TRIPLE ANTIBIOTIC) [phone] OINT Apply 1 application topically as needed (for skin tears/abrasions).     [provider]  OVER THE COUNTER MEDICATION Take 2 Wafers by mouth daily. Metamucil apple crisp wafer    [provider]  potassium chloride SA (K-DUR,KLOR-CON) 20 MEQ tablet Take 20 mEq by mouth daily with breakfast.  11/17/16   [provider]    Family History Family History  Problem Relation Age  of Onset  . Cancer Mother   . Alzheimer's disease Neg Hx     Social History Social History   Tobacco Use  . Smoking status: Never Smoker  . Smokeless tobacco: Never Used  Substance Use Topics  . Alcohol use: No  . Drug use: No     Allergies   Patient has no known allergies.   Review of Systems Review of Systems  Unable to perform ROS: Dementia     Physical Exam Updated Vital Signs SpO2 94%   Physical Exam  Constitutional: She appears well-developed and well-nourished.  HENT:  Head: Normocephalic and atraumatic.  No hematoma or abrasions noted to the scalp  Eyes: EOM are normal.    Neck: Normal range of motion. Neck supple.  C-spine nontender  Pulmonary/Chest: Effort normal.  Abdominal: She exhibits no distension.  Musculoskeletal: Normal range of motion.  Full range of motion of bilateral upper and lower extremity major joints without obvious deformity.  Neurological: She is alert.  Follow simple commands.  Psychiatric: She has a normal mood and affect.  Nursing note and vitals reviewed.    ED Treatments / Results  Labs (all labs ordered are listed, but only abnormal results are displayed) Labs Reviewed - No data to display  EKG None  Radiology No results found.  Procedures Procedures (including critical care time)  Medications Ordered in ED Medications - No data to display   Initial Impression / Assessment and Plan / ED Course  I have reviewed the triage vital signs and the nursing notes.  Pertinent labs & imaging results that were available during my care of the patient were reviewed by me and considered in my medical decision making (see chart for details).     No obvious external signs of head trauma.  Full range of motion of all major joints.  No use of anticoagulants.  No indication for advanced imaging.  Discharged home with head injury warnings back to her nursing facility.  Final Clinical Impressions(s) / ED Diagnoses   Final diagnoses:  Fall, initial encounter  Minor head injury, initial encounter    ED Discharge Orders    None       Jola Schmidt, MD 12/29/17 1311

## 2018-01-23 ENCOUNTER — Encounter (HOSPITAL_COMMUNITY): Payer: Self-pay | Admitting: *Deleted

## 2018-01-23 ENCOUNTER — Emergency Department (HOSPITAL_COMMUNITY): Payer: Medicare Other

## 2018-01-23 ENCOUNTER — Emergency Department (HOSPITAL_COMMUNITY)
Admission: EM | Admit: 2018-01-23 | Discharge: 2018-01-23 | Disposition: A | Discharge status: Home / Self Care | Payer: Medicare Other | Attending: Emergency Medicine | Admitting: Emergency Medicine

## 2018-01-23 DIAGNOSIS — Y939 Activity, unspecified: Secondary | ICD-10-CM | POA: Diagnosis not present

## 2018-01-23 DIAGNOSIS — Z79899 Other long term (current) drug therapy: Secondary | ICD-10-CM | POA: Diagnosis not present

## 2018-01-23 DIAGNOSIS — E039 Hypothyroidism, unspecified: Secondary | ICD-10-CM | POA: Diagnosis not present

## 2018-01-23 DIAGNOSIS — Y999 Unspecified external cause status: Secondary | ICD-10-CM | POA: Insufficient documentation

## 2018-01-23 DIAGNOSIS — E119 Type 2 diabetes mellitus without complications: Secondary | ICD-10-CM | POA: Insufficient documentation

## 2018-01-23 DIAGNOSIS — W2203XA Walked into furniture, initial encounter: Secondary | ICD-10-CM | POA: Insufficient documentation

## 2018-01-23 DIAGNOSIS — Y929 Unspecified place or not applicable: Secondary | ICD-10-CM | POA: Insufficient documentation

## 2018-01-23 DIAGNOSIS — I10 Essential (primary) hypertension: Secondary | ICD-10-CM | POA: Insufficient documentation

## 2018-01-23 DIAGNOSIS — S0083XA Contusion of other part of head, initial encounter: Secondary | ICD-10-CM | POA: Insufficient documentation

## 2018-01-23 DIAGNOSIS — S0990XA Unspecified injury of head, initial encounter: Secondary | ICD-10-CM | POA: Diagnosis present

## 2018-01-23 NOTE — ED Provider Notes (Signed)
[facility] DEPT Provider Note   CSN: [id number] Arrival date & time: 01/23/18  1229     History   Chief Complaint Chief Complaint  Patient presents with  . Facial Injury    HPI Emily Heath is a 84 y.o. female.  Pt presents to the ED today with bruising to her face.  Per EMS, pt was at [facility] and was walking in the hallway and hit her head on a TV.  It is unclear how this occurred.  She apparently did not fall.  No loc.  She has dementia and is unable to give any hx.  The pt denies any pain.     Past Medical History:  Diagnosis Date  . Anxiety   . Dementia   . Depression   . Diabetes mellitus without complication ([place])   . Hyperlipemia   . Hypertension     Patient Active Problem List   Diagnosis Date Noted  . Abrasion   . Contusion of right elbow   . Cough   . Fall   . Altered mental status 06/18/2016  . Syncope and collapse 06/18/2016  . Dementia with behavioral disturbance 05/12/2016  . ONYCHOMYCOSIS 12/19/2006  . HYPERLIPIDEMIA 12/19/2006  . MENINGIOMA 10/04/2006  . HYPOTHYROIDISM 10/04/2006  . OSTEOPENIA 10/04/2006    Past Surgical History:  Procedure Laterality Date  . no surgerical history       OB History   None      Home Medications    Prior to Admission medications   Medication Sig Start Date End Date Taking? Authorizing Provider  acetaminophen (TYLENOL) 500 MG tablet Take 500 mg by mouth 2 (two) times daily.    Yes [provider]  alum & mag hydroxide-simeth (MINTOX) 200-200-20 MG/5ML suspension Take 30 mLs by mouth as needed for indigestion or heartburn.   Yes [provider]  divalproex (DEPAKOTE) 125 MG DR tablet Take 125 mg by mouth 3 (three) times daily.  10/29/16  Yes [provider]  divalproex (DEPAKOTE) 250 MG DR tablet Take 250 mg by mouth 2 (two) times daily. 01/15/18  Yes [provider]  donepezil (ARICEPT) 10 MG tablet Take 10 mg by mouth at bedtime.   05/29/16  Yes [provider]  ergocalciferol (VITAMIN D2) 50000 units capsule Take 50,000 Units by mouth every Friday.    Yes [provider]  levothyroxine (SYNTHROID, LEVOTHROID) 25 MCG tablet Take 25 mcg by mouth daily before breakfast.  05/29/16  Yes [provider]  loperamide (IMODIUM) 2 MG capsule Take 2 mg by mouth as needed for diarrhea or loose stools.   Yes [provider]  LORazepam (ATIVAN) 0.5 MG tablet Take 0.5 mg by mouth 2 (two) times daily. 01/15/18  Yes [provider]  magnesium hydroxide (MILK OF MAGNESIA) 400 MG/5ML suspension Take 30 mLs by mouth at bedtime as needed (for constipation).    Yes [provider]  Melatonin 5 MG TABS Take 5 mg by mouth at bedtime.    Yes [provider]  Neomycin-Bacitracin-Polymyxin (TRIPLE ANTIBIOTIC) [phone] OINT Apply 1 application topically as needed (for skin tears/abrasions).    Yes [provider]  OVER THE COUNTER MEDICATION Take 2 Wafers by mouth daily. Metamucil apple crisp wafer   Yes [provider]  potassium chloride SA (K-DUR,KLOR-CON) 20 MEQ tablet Take 20 mEq by mouth daily with breakfast.  11/17/16  Yes [provider]  triamcinolone cream (KENALOG) 0.1 % Apply 1 application topically 2 (two) times  daily. 12/16/17  Yes [provider]    Family History Family History  Problem Relation Age of Onset  . Cancer Mother   . Alzheimer's disease Neg Hx     Social History Social History   Tobacco Use  . Smoking status: Never Smoker  . Smokeless tobacco: Never Used  Substance Use Topics  . Alcohol use: No  . Drug use: No     Allergies   Patient has no known allergies.   Review of Systems Review of Systems  Unable to perform ROS: Dementia  HENT: Positive for facial swelling.   All other systems reviewed and are negative.    Physical Exam Updated Vital Signs BP (!) 122/47 (BP Location: Right Arm)   Pulse 62    Temp 98.2 F (36.8 C) (Oral)   Resp 14   SpO2 98%   Physical Exam  Constitutional: She appears well-developed and well-nourished.  HENT:  Head: Normocephalic.  Right Ear: External ear normal.  Left Ear: External ear normal.  Nose: Nose normal.  Mouth/Throat: Oropharynx is clear and moist.  Bruising to forehead and around left eye  Eyes: Pupils are equal, round, and reactive to light. Conjunctivae and EOM are normal.  Neck: Normal range of motion. Neck supple.  Cardiovascular: Normal rate, regular rhythm, normal heart sounds and intact distal pulses.  Pulmonary/Chest: Effort normal and breath sounds normal.  Abdominal: Soft. Bowel sounds are normal.  Musculoskeletal: Normal range of motion.  Neurological: She is alert.  Pt is not oriented (baseline)  Skin: Capillary refill takes less than 2 seconds.  Psychiatric: She has a normal mood and affect.  Nursing note and vitals reviewed.    ED Treatments / Results  Labs (all labs ordered are listed, but only abnormal results are displayed) Labs Reviewed - No data to display  EKG None  Radiology Ct Head Wo Contrast  Result Date: 01/23/2018 CLINICAL DATA:  Per EMS, pt from [facility] was walking in the hallway and hit her head on a TV. Pt has bruising to left eye. Pt has dementia, is at baseline mental status. Pt did not fall, no loss of consciousness. EXAM: CT HEAD WITHOUT CONTRAST CT MAXILLOFACIAL WITHOUT CONTRAST TECHNIQUE: Multidetector CT imaging of the head and maxillofacial structures were performed using the standard protocol without intravenous contrast. Multiplanar CT image reconstructions of the maxillofacial structures were also generated. COMPARISON:  03/28/2017 and earlier and MRI of the brain on 04/03/2004 FINDINGS: CT HEAD FINDINGS Brain: There is mild central and cortical atrophy. A calcified extra-axial lesion in the RIGHT parietal lobe is consistent with stable calcified meningioma. Periventricular white matter  changes are consistent with small vessel disease. There is no intra or extra-axial fluid collection or mass lesion. The basilar cisterns and ventricles have a normal appearance. There is no CT evidence for acute infarction or hemorrhage. Vascular: There is dense atherosclerotic calcification of the internal carotid arteries. Skull: Hyperostosis frontalis. There is edema of the frontal scalp, LEFT greater than RIGHT. No underlying calvarial fracture. Other: There is cerumen within bilateralexternal auditory canals. CT MAXILLOFACIAL FINDINGS Osseous: There is mild irregularity of the LEFT nasal bone, consistent with remote fracture. No acute fracture or subluxation. Orbits: There is soft tissue swelling involving the preseptal LEFT orbit. No acute orbital fracture. The globes are intact. Sinuses: Mild mucosal thickening of the ethmoid and maxillary sinuses. No air-fluid levels. Soft tissues: LEFT frontal scalp edema and hematoma without underlying fracture. IMPRESSION: 1.  No evidence for acute intracranial abnormality. 2. Stable  appearance of RIGHT parietal meningioma. 3. LEFT frontal scalp edema/hematoma without underlying fracture. 4. Orbits are intact. There is mild preseptal soft tissue swelling of the LEFT orbit. 5.  There is cerumen within bilateralexternal auditory canals. Electronically Signed   By: Nolon Nations M.D.   On: 01/23/2018 14:49   Ct Maxillofacial Wo Contrast  Result Date: 01/23/2018 CLINICAL DATA:  Per EMS, pt from [facility] was walking in the hallway and hit her head on a TV. Pt has bruising to left eye. Pt has dementia, is at baseline mental status. Pt did not fall, no loss of consciousness. EXAM: CT HEAD WITHOUT CONTRAST CT MAXILLOFACIAL WITHOUT CONTRAST TECHNIQUE: Multidetector CT imaging of the head and maxillofacial structures were performed using the standard protocol without intravenous contrast. Multiplanar CT image reconstructions of the maxillofacial structures were also  generated. COMPARISON:  03/28/2017 and earlier and MRI of the brain on 04/03/2004 FINDINGS: CT HEAD FINDINGS Brain: There is mild central and cortical atrophy. A calcified extra-axial lesion in the RIGHT parietal lobe is consistent with stable calcified meningioma. Periventricular white matter changes are consistent with small vessel disease. There is no intra or extra-axial fluid collection or mass lesion. The basilar cisterns and ventricles have a normal appearance. There is no CT evidence for acute infarction or hemorrhage. Vascular: There is dense atherosclerotic calcification of the internal carotid arteries. Skull: Hyperostosis frontalis. There is edema of the frontal scalp, LEFT greater than RIGHT. No underlying calvarial fracture. Other: There is cerumen within bilateralexternal auditory canals. CT MAXILLOFACIAL FINDINGS Osseous: There is mild irregularity of the LEFT nasal bone, consistent with remote fracture. No acute fracture or subluxation. Orbits: There is soft tissue swelling involving the preseptal LEFT orbit. No acute orbital fracture. The globes are intact. Sinuses: Mild mucosal thickening of the ethmoid and maxillary sinuses. No air-fluid levels. Soft tissues: LEFT frontal scalp edema and hematoma without underlying fracture. IMPRESSION: 1.  No evidence for acute intracranial abnormality. 2. Stable appearance of RIGHT parietal meningioma. 3. LEFT frontal scalp edema/hematoma without underlying fracture. 4. Orbits are intact. There is mild preseptal soft tissue swelling of the LEFT orbit. 5.  There is cerumen within bilateralexternal auditory canals. Electronically Signed   By: Nolon Nations M.D.   On: 01/23/2018 14:49    Procedures Procedures (including critical care time)  Medications Ordered in ED Medications - No data to display   Initial Impression / Assessment and Plan / ED Course  I have reviewed the triage vital signs and the nursing notes.  Pertinent labs & imaging results  that were available during my care of the patient were reviewed by me and considered in my medical decision making (see chart for details).    CT negative for acute other than scalp hematoma and soft tissue swelling.  No internal injury.  Pt is stable for d/c.  Final Clinical Impressions(s) / ED Diagnoses   Final diagnoses:  Contusion of face, initial encounter    ED Discharge Orders    None       Isla Pence, MD 01/23/18 [id number]

## 2018-01-23 NOTE — ED Triage Notes (Signed)
Per EMS, pt from [facility] was walking in the hallway and hit her head on a TV. Pt has bruising to left eye. Pt has dementia, is at baseline mental status. Pt did not fall, no loss of consciousness.

## 2018-05-06 IMAGING — CR DG CHEST 2V
2 series · 2 of 2 positions shown · non-contrast
Comparison: Chest x-rays dated 11/24/2016 and 06/19/2016.

CLINICAL DATA: Diarrhea

EXAM:
CHEST  2 VIEW

[w chest lat]
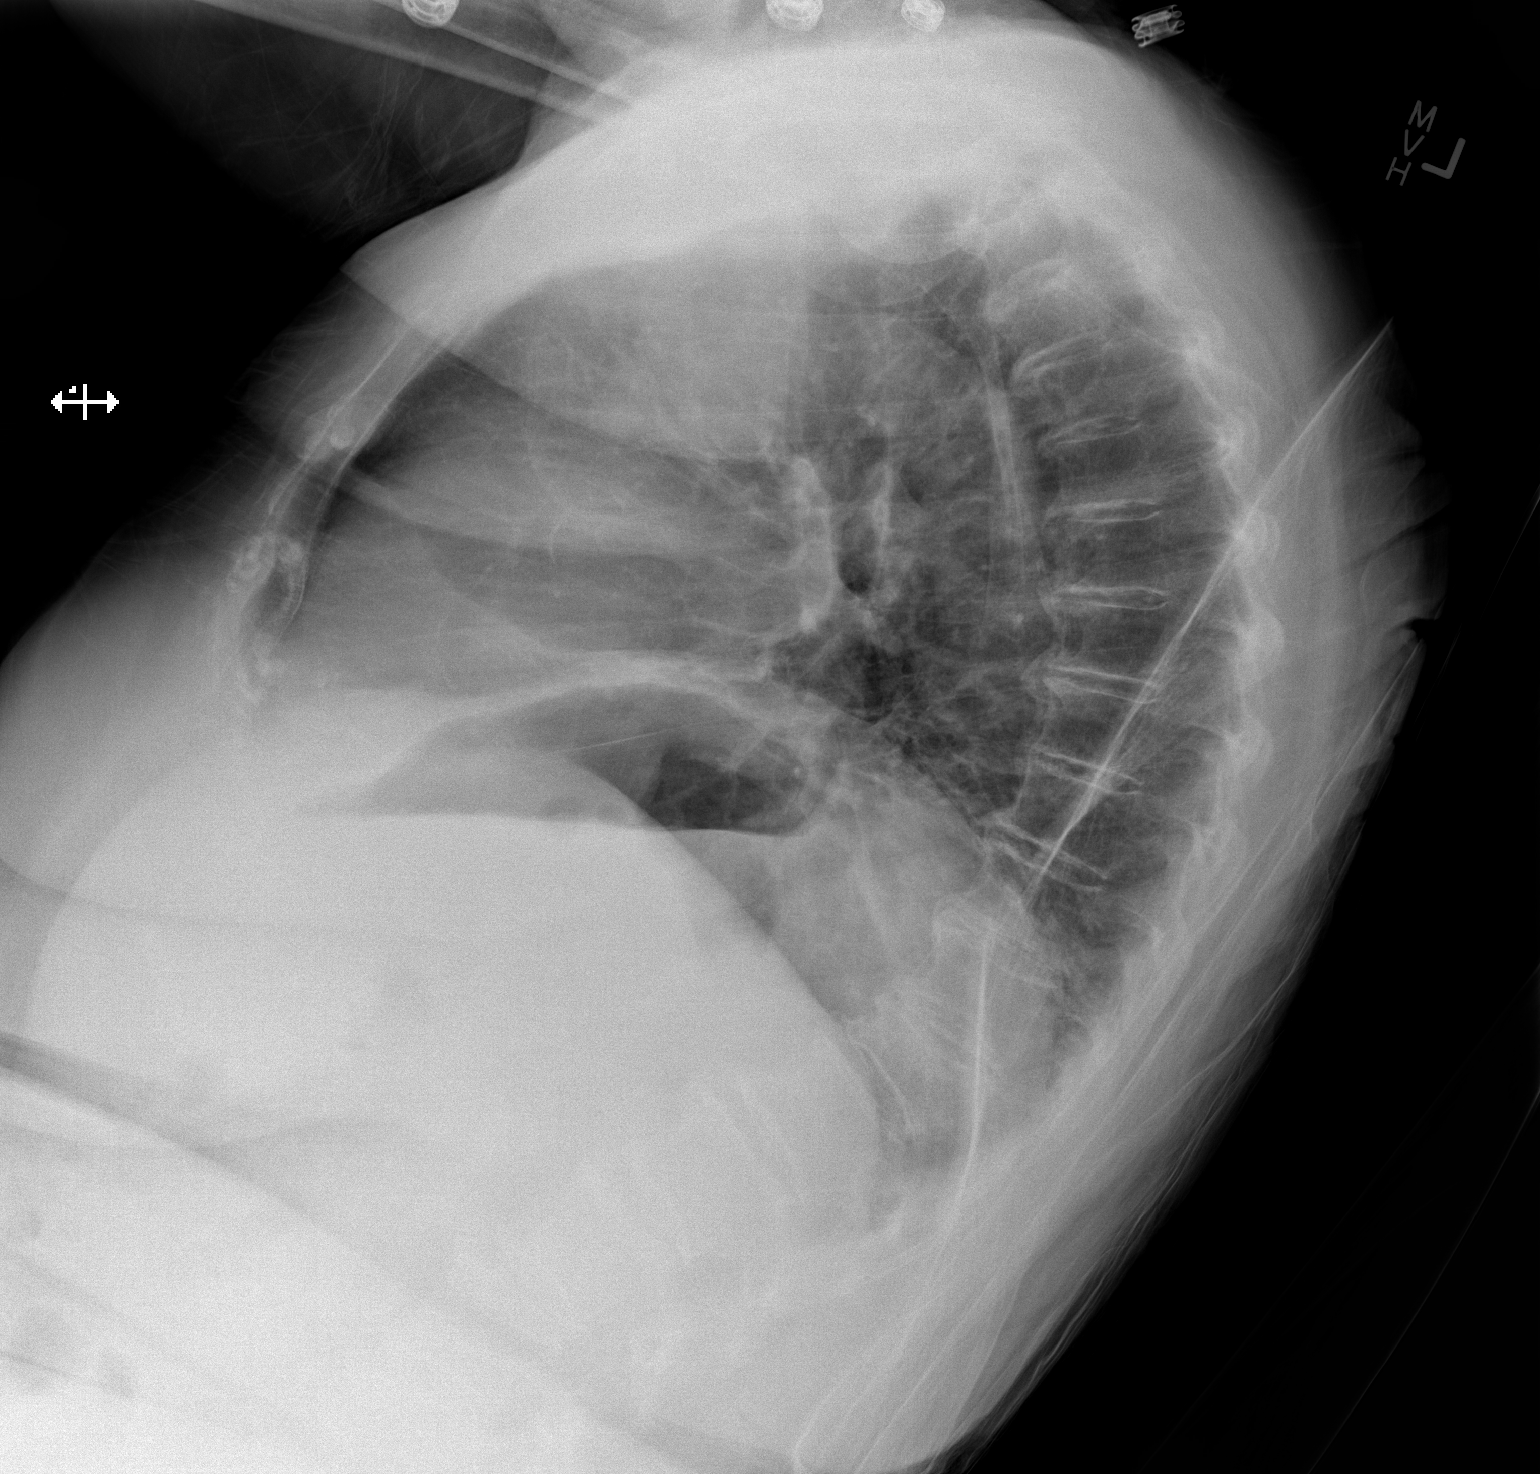

[x chest ap]
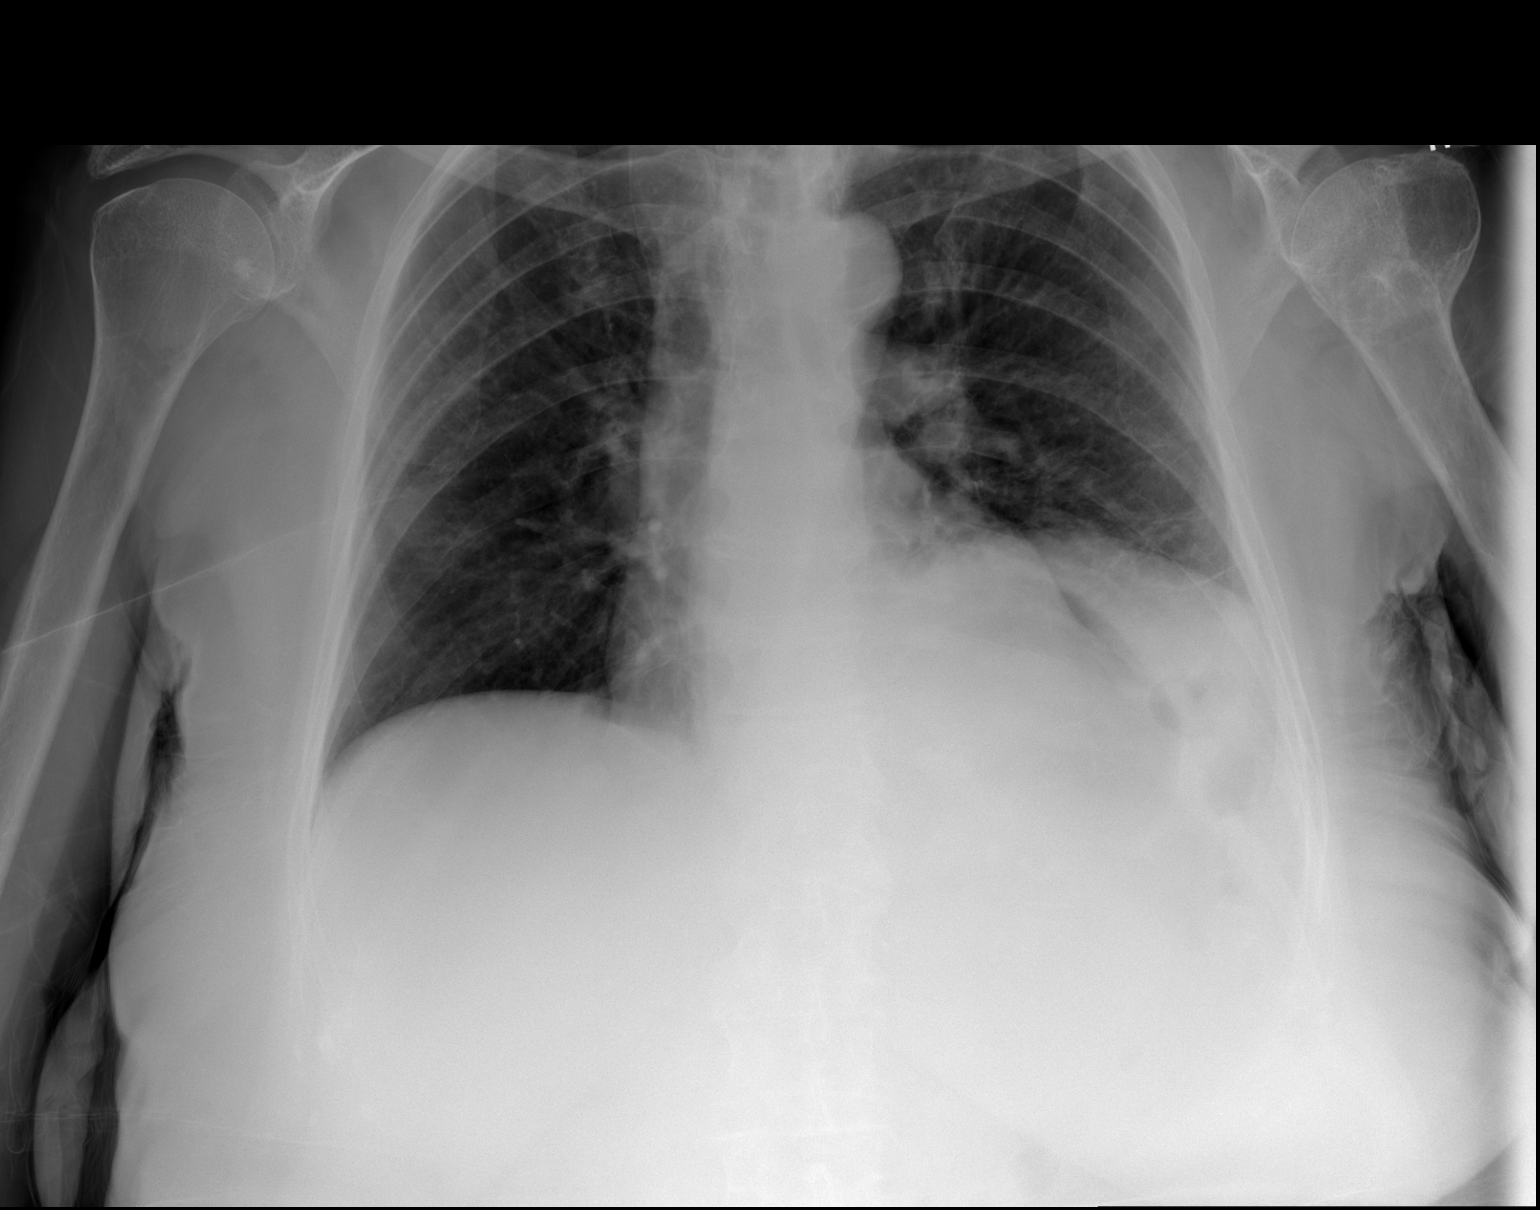

[2 of 2 positions shown; findings below may reference images not displayed]

FINDINGS: Stable elevation of the left hemidiaphragm. Lungs are clear. No
pleural effusion or pneumothorax. Heart size and mediastinal
contours stable. Atherosclerotic changes again noted at the aortic
arch.

No acute appearing osseous abnormality. Compression fracture
deformity of the T12 vertebral body is stable compared to plain film
and CT of 11/24/2016, new compared to the earlier plain film of
06/19/2016.
IMPRESSION: No active cardiopulmonary disease. No evidence of pneumonia or
pulmonary edema.
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
## 2019-01-24 DEATH — deceased
